# Patient Record
Sex: Female | Born: 1960 | Hispanic: No | Marital: Married | Smoking: Never smoker
Health system: Southern US, Community
[De-identification: ages and names within clinical notes are randomized; demographics above are authoritative.]

## PROBLEM LIST (undated history)

## (undated) HISTORY — PX: BREAST CYST ASPIRATION: SHX578

---

## 2006-01-08 ENCOUNTER — Other Ambulatory Visit: Admission: RE | Admit: 2006-01-08 | Discharge: 2006-01-08 | Payer: Self-pay | Admitting: Family Medicine

## 2008-12-23 HISTORY — PX: APPENDECTOMY: SHX54

## 2015-10-25 ENCOUNTER — Ambulatory Visit (INDEPENDENT_AMBULATORY_CARE_PROVIDER_SITE_OTHER): Admitting: Family Medicine

## 2015-10-25 ENCOUNTER — Encounter: Payer: Self-pay | Admitting: Family Medicine

## 2015-10-25 VITALS — BP 130/81 | HR 97 | Temp 97.3°F | Ht 62.0 in | Wt 127.8 lb

## 2015-10-25 DIAGNOSIS — R300 Dysuria: Secondary | ICD-10-CM | POA: Diagnosis not present

## 2015-10-25 DIAGNOSIS — N390 Urinary tract infection, site not specified: Secondary | ICD-10-CM | POA: Diagnosis not present

## 2015-10-25 LAB — POCT URINALYSIS DIPSTICK
BILIRUBIN UA: NEGATIVE
Glucose, UA: NEGATIVE
KETONES UA: NEGATIVE
Nitrite, UA: NEGATIVE
PROTEIN UA: NEGATIVE
Urobilinogen, UA: NEGATIVE
pH, UA: 6

## 2015-10-25 LAB — POCT UA - MICROSCOPIC ONLY
Bacteria, U Microscopic: NEGATIVE
CRYSTALS, UR, HPF, POC: NEGATIVE
Casts, Ur, LPF, POC: NEGATIVE
Mucus, UA: NEGATIVE
Yeast, UA: NEGATIVE

## 2015-10-25 MED ORDER — SULFAMETHOXAZOLE-TRIMETHOPRIM 800-160 MG PO TABS: 1.0000 | ORAL_TABLET | Freq: Two times a day (BID) | ORAL | Status: DC

## 2015-10-25 NOTE — Progress Notes (Signed)
BP 130/81 mmHg  Pulse 97  Temp(Src) 97.3 F (36.3 C) (Oral)  Ht 5\' 2"  (1.575 m)  Wt 127 lb 12.8 oz (57.97 kg)  BMI 23.37 kg/m2  LMP 05/23/2013 (Approximate)   Subjective:    Patient ID: Shelby Skinner, female    DOB: [birth year], 54 y.o.   MRN: [id number]  HPI: Shelby Skinner is a 54 y.o. female presenting on 10/25/2015 for Urinary Tract Infection   HPI Dysuria  Patient has been having odor in urine for about a week. She tried to use cranberry pills and increase fluid to hydrate out and it got slightly better but then worsened over the last 2 days. She is now having some suprapubic abdominal discomfort and burning sensation. And she also was having increased urinary frequency and burning when she urinates. She denies fevers or chills or flank pain.  Relevant past medical, surgical, family and social history reviewed and updated as indicated. Interim medical history since our last visit reviewed. Allergies and medications reviewed and updated.  Review of Systems  Constitutional: Negative for fever and chills.  HENT: Negative for congestion, ear discharge and ear pain.   Eyes: Negative for redness and visual disturbance.  Respiratory: Negative for chest tightness and shortness of breath.   Cardiovascular: Negative for chest pain and leg swelling.  Gastrointestinal: Positive for abdominal pain (suprapubic).  Genitourinary: Positive for dysuria, urgency and frequency. Negative for hematuria, flank pain, vaginal bleeding, vaginal discharge, difficulty urinating and vaginal pain.  Musculoskeletal: Negative for back pain and gait problem.  Skin: Negative for rash.  Neurological: Negative for light-headedness and headaches.  Psychiatric/Behavioral: Negative for behavioral problems and agitation.  All other systems reviewed and are negative.   Per HPI unless specifically indicated above     Medication List       This list is accurate as of: 10/25/15 11:25 AM.  Always use your  most recent med list.               Cranberry 1000 MG Caps  Take by mouth.     sulfamethoxazole-trimethoprim 800-160 MG tablet  Commonly known as:  BACTRIM DS  Take 1 tablet by mouth 2 (two) times daily.           Objective:    BP 130/81 mmHg  Pulse 97  Temp(Src) 97.3 F (36.3 C) (Oral)  Ht 5\' 2"  (1.575 m)  Wt 127 lb 12.8 oz (57.97 kg)  BMI 23.37 kg/m2  LMP 05/23/2013 (Approximate)  Wt Readings from Last 3 Encounters:  10/25/15 127 lb 12.8 oz (57.97 kg)    Physical Exam  Constitutional: She is oriented to person, place, and time. She appears well-developed and well-nourished. No distress.  Eyes: Conjunctivae and EOM are normal.  Cardiovascular: Normal rate, regular rhythm, normal heart sounds and intact distal pulses.   No murmur heard. Pulmonary/Chest: Effort normal and breath sounds normal. No respiratory distress. She has no wheezes.  Abdominal: Soft. Bowel sounds are normal. She exhibits no distension. There is tenderness (Mild suprapubic discomfort, no CVA tenderness). There is no rebound and no guarding.  Musculoskeletal: Normal range of motion. She exhibits no edema or tenderness.  Neurological: She is alert and oriented to person, place, and time. Coordination normal.  Skin: Skin is warm and dry. No rash noted. She is not diaphoretic.  Psychiatric: She has a normal mood and affect. Her behavior is normal.  Vitals reviewed.   Results for orders placed or performed in visit on 10/25/15  POCT UA -  Microscopic Only  Result Value Ref Range   WBC, Ur, HPF, POC 5-10    RBC, urine, microscopic occ    Bacteria, U Microscopic neg    Mucus, UA neg    Epithelial cells, urine per micros occ    Crystals, Ur, HPF, POC neg    Casts, Ur, LPF, POC neg    Yeast, UA neg   POCT urinalysis dipstick  Result Value Ref Range   Color, UA yellow    Clarity, UA clear    Glucose, UA neg    Bilirubin, UA neg    Ketones, UA neg    Spec Grav, UA <=1.005    Blood, UA trace     pH, UA 6.0    Protein, UA neg    Urobilinogen, UA negative    Nitrite, UA neg    Leukocytes, UA moderate (2+) (A) Negative      Assessment & Plan:   Problem List Items Addressed This Visit    None    Visit Diagnoses    Dysuria    -  Primary    Relevant Medications    sulfamethoxazole-trimethoprim (BACTRIM DS) 800-160 MG tablet    Other Relevant Orders    POCT UA - Microscopic Only (Completed)    POCT urinalysis dipstick (Completed)    UTI (lower urinary tract infection)        Relevant Medications    sulfamethoxazole-trimethoprim (BACTRIM DS) 800-160 MG tablet    Other Relevant Orders    Urine culture        Follow up plan: Return in about 2 months (around 12/25/2015), or if symptoms worsen or fail to improve, for Well woman exam/Pap/screening labs.  Arville Care, MD [facility] 10/25/2015, 11:25 AM

## 2015-10-27 LAB — URINE CULTURE

## 2016-02-07 ENCOUNTER — Ambulatory Visit (INDEPENDENT_AMBULATORY_CARE_PROVIDER_SITE_OTHER): Admitting: Family Medicine

## 2016-02-07 ENCOUNTER — Encounter: Payer: Self-pay | Admitting: Family Medicine

## 2016-02-07 VITALS — BP 138/86 | HR 71 | Temp 97.7°F | Ht 62.0 in | Wt 128.2 lb

## 2016-02-07 DIAGNOSIS — Z114 Encounter for screening for human immunodeficiency virus [HIV]: Secondary | ICD-10-CM

## 2016-02-07 DIAGNOSIS — R5382 Chronic fatigue, unspecified: Secondary | ICD-10-CM

## 2016-02-07 DIAGNOSIS — Z01419 Encounter for gynecological examination (general) (routine) without abnormal findings: Secondary | ICD-10-CM | POA: Diagnosis not present

## 2016-02-07 DIAGNOSIS — Z1159 Encounter for screening for other viral diseases: Secondary | ICD-10-CM

## 2016-02-07 DIAGNOSIS — Z13220 Encounter for screening for lipoid disorders: Secondary | ICD-10-CM

## 2016-02-07 DIAGNOSIS — Z1211 Encounter for screening for malignant neoplasm of colon: Secondary | ICD-10-CM

## 2016-02-07 DIAGNOSIS — Z0000 Encounter for general adult medical examination without abnormal findings: Secondary | ICD-10-CM

## 2016-02-07 DIAGNOSIS — Z131 Encounter for screening for diabetes mellitus: Secondary | ICD-10-CM

## 2016-02-07 DIAGNOSIS — E049 Nontoxic goiter, unspecified: Secondary | ICD-10-CM

## 2016-02-07 NOTE — Progress Notes (Signed)
BP 138/86 mmHg  Pulse 71  Temp(Src) 97.7 F (36.5 C) (Oral)  Ht 5' 2" (1.575 m)  Wt 128 lb 3.2 oz (58.151 kg)  BMI 23.44 kg/m2  LMP 05/23/2013 (Approximate)   Subjective:    Patient ID: Shelby Skinner, female    DOB: [birth year], 55 y.o.   MRN: [id number]  HPI: Shelby Skinner is a 55 y.o. female presenting on 02/07/2016 for Annual Exam and Gynecologic Exam   HPI Well woman exam Patient is coming in today for well woman exam. It is been quite a few years since she's had a Pap and will exam. She did have a mammogram 3 years ago which was normal. She has not had a menstrual period in over a year and a half so she is all into menopause at this point. She denies any chest pain, shortness of breath, headaches or vision issues, abdominal complaints, diarrhea, nausea, vomiting, or joint issues. She does complain of some fatigue and has a known thyroid goiter this been worked up in the past. She also complains of memory issues and wants to have her memory tested and wants a test B-12 and vitamin D because of it. She is currently taking replacement performed wants to see where they're at.  Relevant past medical, surgical, family and social history reviewed and updated as indicated. Interim medical history since our last visit reviewed. Allergies and medications reviewed and updated.  Review of Systems  Constitutional: Positive for fatigue. Negative for fever and chills.  HENT: Negative for congestion, ear discharge and ear pain.   Eyes: Negative for redness and visual disturbance.  Respiratory: Negative for chest tightness and shortness of breath.   Cardiovascular: Negative for chest pain and leg swelling.  Genitourinary: Negative for dysuria and difficulty urinating.  Musculoskeletal: Negative for back pain and gait problem.  Skin: Negative for rash.  Neurological: Negative for light-headedness and headaches.  Psychiatric/Behavioral: Negative for suicidal ideas, behavioral problems,  sleep disturbance, self-injury, dysphoric mood and agitation. The patient is not nervous/anxious.   All other systems reviewed and are negative.   Per HPI unless specifically indicated above     Medication List       This list is accurate as of: 02/07/16  9:48 AM.  Always use your most recent med list.               cholecalciferol 1000 units tablet  Commonly known as:  VITAMIN D  Take 1,000 Units by mouth daily.     Cranberry 1000 MG Caps  Take by mouth. As needed     vitamin B-12 100 MCG tablet  Commonly known as:  CYANOCOBALAMIN  Take 100 mcg by mouth daily.           Objective:    BP 138/86 mmHg  Pulse 71  Temp(Src) 97.7 F (36.5 C) (Oral)  Ht 5' 2" (1.575 m)  Wt 128 lb 3.2 oz (58.151 kg)  BMI 23.44 kg/m2  LMP 05/23/2013 (Approximate)  Wt Readings from Last 3 Encounters:  02/07/16 128 lb 3.2 oz (58.151 kg)  10/25/15 127 lb 12.8 oz (57.97 kg)    Physical Exam  Constitutional: She is oriented to person, place, and time. She appears well-developed and well-nourished. No distress.  Eyes: Conjunctivae and EOM are normal. Pupils are equal, round, and reactive to light.  Neck: Neck supple. No tracheal deviation present. Thyromegaly (smooth and slightly enlarged thyroid) present.  Cardiovascular: Normal rate, regular rhythm, normal heart sounds and intact distal pulses.  No murmur heard. Pulmonary/Chest: Effort normal and breath sounds normal. No respiratory distress. She has no wheezes. She has no rales. Right breast exhibits no inverted nipple, no mass, no nipple discharge, no skin change and no tenderness. Left breast exhibits no inverted nipple, no mass, no nipple discharge, no skin change and no tenderness. Breasts are symmetrical.  Patient has bilateral fibrocystic/dense breasts but no nodules palpable  Abdominal: Soft. Bowel sounds are normal. She exhibits no distension. There is no tenderness. There is no rebound and no guarding.  Genitourinary: Vagina  normal and uterus normal. No breast swelling, tenderness, discharge or bleeding. Pelvic exam was performed with patient supine. There is no rash or lesion on the right labia. There is no rash or lesion on the left labia. Uterus is not deviated, not enlarged, not fixed and not tender. Cervix exhibits no motion tenderness, no discharge and no friability. Right adnexum displays no mass and no tenderness. Left adnexum displays no mass and no tenderness.  Musculoskeletal: Normal range of motion. She exhibits no edema or tenderness.  Lymphadenopathy:    She has no cervical adenopathy.    She has no axillary adenopathy.  Neurological: She is alert and oriented to person, place, and time. Coordination normal.  Skin: Skin is warm and dry. No rash noted. She is not diaphoretic.  Psychiatric: She has a normal mood and affect. Her behavior is normal.  Nursing note and vitals reviewed.   Results for orders placed or performed in visit on 10/25/15  Urine culture  Result Value Ref Range   Urine Culture, Routine Final report (A)    Urine Culture result 1 Escherichia coli (A)    ANTIMICROBIAL SUSCEPTIBILITY Comment   POCT UA - Microscopic Only  Result Value Ref Range   WBC, Ur, HPF, POC 5-10    RBC, urine, microscopic occ    Bacteria, U Microscopic neg    Mucus, UA neg    Epithelial cells, urine per micros occ    Crystals, Ur, HPF, POC neg    Casts, Ur, LPF, POC neg    Yeast, UA neg   POCT urinalysis dipstick  Result Value Ref Range   Color, UA yellow    Clarity, UA clear    Glucose, UA neg    Bilirubin, UA neg    Ketones, UA neg    Spec Grav, UA <=1.005    Blood, UA trace    pH, UA 6.0    Protein, UA neg    Urobilinogen, UA negative    Nitrite, UA neg    Leukocytes, UA moderate (2+) (A) Negative   MMSE of 30 out of 30    Assessment & Plan:   Problem List Items Addressed This Visit    None    Visit Diagnoses    Well woman exam with routine gynecological exam    -  Primary     Relevant Orders    Pap IG, rfx HPV all pth    Special screening for malignant neoplasms, colon        Relevant Orders    Ambulatory referral to Gastroenterology    Screening for diabetes mellitus        Relevant Orders    CMP14+EGFR    Screening for lipid disorders        Relevant Orders    Lipid panel    Thyroid goiter        Relevant Orders    TSH    Chronic fatigue  Relevant Orders    VITAMIN D 25 Hydroxy (Vit-D Deficiency, Fractures)    Vitamin B12    TSH    CBC with Differential/Platelet    Need for hepatitis C screening test        Relevant Orders    Hepatitis C antibody    Encounter for screening for HIV        Relevant Orders    HIV antibody        Follow up plan: Return in about 1 year (around 02/06/2017), or if symptoms worsen or fail to improve.  Counseling provided for all of the vaccine components Orders Placed This Encounter  Procedures  . CMP14+EGFR  . Lipid panel  . VITAMIN D 25 Hydroxy (Vit-D Deficiency, Fractures)  . Vitamin B12  . TSH  . CBC with Differential/Platelet  . HIV antibody  . Hepatitis C antibody  . Ambulatory referral to Gastroenterology    Caryl Pina, MD [facility] 02/07/2016, 9:48 AM

## 2016-02-08 LAB — HIV ANTIBODY (ROUTINE TESTING W REFLEX): HIV Screen 4th Generation wRfx: NONREACTIVE

## 2016-02-08 LAB — CMP14+EGFR
A/G RATIO: 2.1 (ref 1.1–2.5)
ALBUMIN: 4.7 g/dL (ref 3.5–5.5)
ALK PHOS: 63 IU/L (ref 39–117)
ALT: 13 IU/L (ref 0–32)
AST: 23 IU/L (ref 0–40)
BILIRUBIN TOTAL: 0.5 mg/dL (ref 0.0–1.2)
BUN / CREAT RATIO: 16 (ref 9–23)
BUN: 14 mg/dL (ref 6–24)
CHLORIDE: 102 mmol/L (ref 96–106)
CO2: 24 mmol/L (ref 18–29)
Calcium: 10 mg/dL (ref 8.7–10.2)
Creatinine, Ser: 0.9 mg/dL (ref 0.57–1.00)
GFR calc Af Amer: 84 mL/min/{1.73_m2} (ref >59)
GFR calc non Af Amer: 73 mL/min/{1.73_m2} (ref >59)
GLOBULIN, TOTAL: 2.2 g/dL (ref 1.5–4.5)
Glucose: 91 mg/dL (ref 65–99)
POTASSIUM: 4.6 mmol/L (ref 3.5–5.2)
SODIUM: 142 mmol/L (ref 134–144)
Total Protein: 6.9 g/dL (ref 6.0–8.5)

## 2016-02-08 LAB — CBC WITH DIFFERENTIAL/PLATELET
BASOS: 1 %
Basophils Absolute: 0 10*3/uL (ref 0.0–0.2)
EOS (ABSOLUTE): 0.1 10*3/uL (ref 0.0–0.4)
Eos: 2 %
HEMOGLOBIN: 13.1 g/dL (ref 11.1–15.9)
Hematocrit: 39.1 % (ref 34.0–46.6)
Immature Grans (Abs): 0 10*3/uL (ref 0.0–0.1)
Immature Granulocytes: 0 %
LYMPHS ABS: 1.2 10*3/uL (ref 0.7–3.1)
Lymphs: 27 %
MCH: 28.6 pg (ref 26.6–33.0)
MCHC: 33.5 g/dL (ref 31.5–35.7)
MCV: 85 fL (ref 79–97)
MONOCYTES: 6 %
MONOS ABS: 0.3 10*3/uL (ref 0.1–0.9)
Neutrophils Absolute: 3 10*3/uL (ref 1.4–7.0)
Neutrophils: 64 %
Platelets: 227 10*3/uL (ref 150–379)
RBC: 4.58 x10E6/uL (ref 3.77–5.28)
RDW: 13.5 % (ref 12.3–15.4)
WBC: 4.7 10*3/uL (ref 3.4–10.8)

## 2016-02-08 LAB — LIPID PANEL
CHOLESTEROL TOTAL: 209 mg/dL — AB (ref 100–199)
Chol/HDL Ratio: 4.1 ratio units (ref 0.0–4.4)
HDL: 51 mg/dL (ref >39)
LDL CALC: 131 mg/dL — AB (ref 0–99)
Triglycerides: 135 mg/dL (ref 0–149)
VLDL Cholesterol Cal: 27 mg/dL (ref 5–40)

## 2016-02-08 LAB — HEPATITIS C ANTIBODY

## 2016-02-08 LAB — VITAMIN B12: Vitamin B-12: 501 pg/mL (ref 211–946)

## 2016-02-08 LAB — VITAMIN D 25 HYDROXY (VIT D DEFICIENCY, FRACTURES): VIT D 25 HYDROXY: 37.8 ng/mL (ref 30.0–100.0)

## 2016-02-08 LAB — TSH: TSH: 1.33 u[IU]/mL (ref 0.450–4.500)

## 2016-02-10 LAB — PAP IG, RFX HPV ALL PTH: PAP SMEAR COMMENT: 0

## 2016-03-12 DIAGNOSIS — Z1211 Encounter for screening for malignant neoplasm of colon: Secondary | ICD-10-CM | POA: Insufficient documentation

## 2016-03-12 LAB — HM COLONOSCOPY

## 2016-05-21 ENCOUNTER — Encounter: Admitting: *Deleted

## 2017-02-13 ENCOUNTER — Encounter: Payer: Self-pay | Admitting: Family

## 2017-02-13 ENCOUNTER — Ambulatory Visit (INDEPENDENT_AMBULATORY_CARE_PROVIDER_SITE_OTHER): Admitting: Family

## 2017-02-13 VITALS — BP 102/64 | HR 59 | Temp 98.6°F | Ht 62.0 in | Wt 127.4 lb

## 2017-02-13 DIAGNOSIS — R05 Cough: Secondary | ICD-10-CM | POA: Diagnosis not present

## 2017-02-13 DIAGNOSIS — B349 Viral infection, unspecified: Secondary | ICD-10-CM

## 2017-02-13 DIAGNOSIS — R059 Cough, unspecified: Secondary | ICD-10-CM

## 2017-02-13 MED ORDER — PREDNISONE 10 MG (21) PO TBPK: ORAL_TABLET | ORAL | 0 refills | Status: DC

## 2017-02-13 NOTE — Progress Notes (Signed)
   Subjective:    Patient ID: Shelby Skinner, female    DOB: Jul 15, 1961, 56 y.o.   MRN: [id number]  Cough  This is a new problem. The current episode started 1 to 4 weeks ago. The problem has been waxing and waning. The problem occurs every few minutes. The cough is productive of sputum. Associated symptoms include chills, ear pain, nasal congestion, postnasal drip and a sore throat. Pertinent negatives include no ear congestion, fever, headaches, myalgias, rhinorrhea, shortness of breath or wheezing. The symptoms are aggravated by lying down. She has tried rest and OTC cough suppressant for the symptoms. The treatment provided mild relief. There is no history of asthma or COPD.      Review of Systems  Constitutional: Positive for chills. Negative for fever.  HENT: Positive for ear pain, postnasal drip and sore throat. Negative for rhinorrhea.   Respiratory: Positive for cough. Negative for shortness of breath and wheezing.   Musculoskeletal: Negative for myalgias.  Neurological: Negative for headaches.  All other systems reviewed and are negative.      Objective:   Physical Exam  Constitutional: She is oriented to person, place, and time. She appears well-developed and well-nourished. No distress.  HENT:  Head: Normocephalic and atraumatic.  Right Ear: External ear normal.  Left Ear: External ear normal.  Nose: Mucosal edema and rhinorrhea present.  Mouth/Throat: Posterior oropharyngeal erythema present.  Eyes: Pupils are equal, round, and reactive to light.  Neck: Normal range of motion. Neck supple. No thyromegaly present.  Cardiovascular: Normal rate, regular rhythm, normal heart sounds and intact distal pulses.   No murmur heard. Pulmonary/Chest: Effort normal and breath sounds normal. No respiratory distress. She has no wheezes.  Abdominal: Soft. Bowel sounds are normal. She exhibits no distension. There is no tenderness.  Musculoskeletal: Normal range of motion. She  exhibits no edema or tenderness.  Neurological: She is alert and oriented to person, place, and time. She has normal reflexes. No cranial nerve deficit.  Skin: Skin is warm and dry.  Psychiatric: She has a normal mood and affect. Her behavior is normal. Judgment and thought content normal.  Vitals reviewed.     BP 102/64   Pulse (!) 59   Temp 98.6 F (37 C) (Oral)   Ht 5\' 2"  (1.575 m)   Wt 127 lb 6.4 oz (57.8 kg)   LMP 05/23/2013 (Approximate)   BMI 23.30 kg/m      Assessment & Plan:  1. Viral illness - predniSONE (STERAPRED UNI-PAK 21 TAB) 10 MG (21) TBPK tablet; Use as directed  Dispense: 21 tablet; Refill: 0  2. Cough  Will send in prednisone dose pack for patient, pt states she will wait a 1-2 days to see if symptoms will improve before starting Force fluids Rest Good hand hygiene discussed RTO prn   Jannifer Rodneyhristy Gaetan Spieker, FNP

## 2017-02-13 NOTE — Patient Instructions (Signed)

## 2017-10-24 ENCOUNTER — Ambulatory Visit (INDEPENDENT_AMBULATORY_CARE_PROVIDER_SITE_OTHER): Admitting: Family

## 2017-10-24 ENCOUNTER — Encounter: Payer: Self-pay | Admitting: Family

## 2017-10-24 VITALS — BP 129/81 | HR 83 | Temp 98.3°F | Ht 62.0 in | Wt 128.0 lb

## 2017-10-24 DIAGNOSIS — J301 Allergic rhinitis due to pollen: Secondary | ICD-10-CM

## 2017-10-24 DIAGNOSIS — J069 Acute upper respiratory infection, unspecified: Secondary | ICD-10-CM

## 2017-10-24 MED ORDER — FLUTICASONE PROPIONATE 50 MCG/ACT NA SUSP: 2.0000 | Freq: Every day | NASAL | 6 refills | Status: DC

## 2017-10-24 NOTE — Patient Instructions (Signed)
Upper Respiratory Infection, Adult Most upper respiratory infections (URIs) are a viral infection of the air passages leading to the lungs. A URI affects the nose, throat, and upper air passages. The most common type of URI is nasopharyngitis and is typically referred to as "the common cold." URIs run their course and usually go away on their own. Most of the time, a URI does not require medical attention, but sometimes a bacterial infection in the upper airways can follow a viral infection. This is called a secondary infection. Sinus and middle ear infections are common types of secondary upper respiratory infections. Bacterial pneumonia can also complicate a URI. A URI can worsen asthma and chronic obstructive pulmonary disease (COPD). Sometimes, these complications can require emergency medical care and may be life threatening. What are the causes? Almost all URIs are caused by viruses. A virus is a type of germ and can spread from one person to another. What increases the risk? You may be at risk for a URI if:  You smoke.  You have chronic heart or lung disease.  You have a weakened defense (immune) system.  You are very young or very old.  You have nasal allergies or asthma.  You work in crowded or poorly ventilated areas.  You work in health care facilities or schools.  What are the signs or symptoms? Symptoms typically develop 2-3 days after you come in contact with a cold virus. Most viral URIs last 7-10 days. However, viral URIs from the influenza virus (flu virus) can last 14-18 days and are typically more severe. Symptoms may include:  Runny or stuffy (congested) nose.  Sneezing.  Cough.  Sore throat.  Headache.  Fatigue.  Fever.  Loss of appetite.  Pain in your forehead, behind your eyes, and over your cheekbones (sinus pain).  Muscle aches.  How is this diagnosed? Your health care provider may diagnose a URI by:  Physical exam.  Tests to check that your  symptoms are not due to another condition such as: ? Strep throat. ? Sinusitis. ? Pneumonia. ? Asthma.  How is this treated? A URI goes away on its own with time. It cannot be cured with medicines, but medicines may be prescribed or recommended to relieve symptoms. Medicines may help:  Reduce your fever.  Reduce your cough.  Relieve nasal congestion.  Follow these instructions at home:  Take medicines only as directed by your health care provider.  Gargle warm saltwater or take cough drops to comfort your throat as directed by your health care provider.  Use a warm mist humidifier or inhale steam from a shower to increase air moisture. This may make it easier to breathe.  Drink enough fluid to keep your urine clear or pale yellow.  Eat soups and other clear broths and maintain good nutrition.  Rest as needed.  Return to work when your temperature has returned to normal or as your health care provider advises. You may need to stay home longer to avoid infecting others. You can also use a face mask and careful hand washing to prevent spread of the virus.  Increase the usage of your inhaler if you have asthma.  Do not use any tobacco products, including cigarettes, chewing tobacco, or electronic cigarettes. If you need help quitting, ask your health care provider. How is this prevented? The best way to protect yourself from getting a cold is to practice good hygiene.  Avoid oral or hand contact with people with cold symptoms.  Wash your   hands often if contact occurs.  There is no clear evidence that vitamin C, vitamin E, echinacea, or exercise reduces the chance of developing a cold. However, it is always recommended to get plenty of rest, exercise, and practice good nutrition. Contact a health care provider if:  You are getting worse rather than better.  Your symptoms are not controlled by medicine.  You have chills.  You have worsening shortness of breath.  You have  brown or red mucus.  You have yellow or brown nasal discharge.  You have pain in your face, especially when you bend forward.  You have a fever.  You have swollen neck glands.  You have pain while swallowing.  You have white areas in the back of your throat. Get help right away if:  You have severe or persistent: ? Headache. ? Ear pain. ? Sinus pain. ? Chest pain.  You have chronic lung disease and any of the following: ? Wheezing. ? Prolonged cough. ? Coughing up blood. ? A change in your usual mucus.  You have a stiff neck.  You have changes in your: ? Vision. ? Hearing. ? Thinking. ? Mood. This information is not intended to replace advice given to you by your health care provider. Make sure you discuss any questions you have with your health care provider. Document Released: 06/04/2001 Document Revised: 08/11/2016 Document Reviewed: 03/16/2014 Elsevier Interactive Patient Education  2017 [employer]  

## 2017-10-24 NOTE — Progress Notes (Signed)
   Subjective:    Patient ID: Shelby HuffCharlene Nightengale, female    DOB: April 16, 1961, 56 y.o.   MRN: [id number]  Sinus Problem  This is a new problem. The current episode started in the past 7 days. The problem is unchanged. There has been no fever. She is experiencing no pain. Associated symptoms include congestion, a hoarse voice, sinus pressure, sneezing and a sore throat. Pertinent negatives include no ear pain or headaches. Treatments tried: claritin  The treatment provided mild relief.      Review of Systems  HENT: Positive for congestion, hoarse voice, sinus pressure, sneezing and sore throat. Negative for ear pain.   Neurological: Negative for headaches.  All other systems reviewed and are negative.      Objective:   Physical Exam  Constitutional: She is oriented to person, place, and time. She appears well-developed and well-nourished. No distress.  HENT:  Head: Normocephalic and atraumatic.  Right Ear: External ear normal.  Left Ear: External ear normal.  Nose: Mucosal edema and rhinorrhea present. Right sinus exhibits no maxillary sinus tenderness and no frontal sinus tenderness. Left sinus exhibits no maxillary sinus tenderness and no frontal sinus tenderness.  Mouth/Throat: Posterior oropharyngeal erythema present.  Eyes: Pupils are equal, round, and reactive to light.  Neck: Normal range of motion. Neck supple. No thyromegaly present.  Cardiovascular: Normal rate, regular rhythm, normal heart sounds and intact distal pulses.   No murmur heard. Pulmonary/Chest: Effort normal and breath sounds normal. No respiratory distress. She has no wheezes.  Abdominal: Soft. Bowel sounds are normal. She exhibits no distension. There is no tenderness.  Musculoskeletal: Normal range of motion. She exhibits no edema or tenderness.  Neurological: She is alert and oriented to person, place, and time. She has normal reflexes. No cranial nerve deficit.  Skin: Skin is warm and dry.  Psychiatric: She  has a normal mood and affect. Her behavior is normal. Judgment and thought content normal.  Vitals reviewed.     Temp 98.3 F (36.8 C) (Oral)   Ht 5\' 2"  (1.575 m)   Wt 128 lb (58.1 kg)   LMP 05/23/2013 (Approximate)   BMI 23.41 kg/m      Assessment & Plan:  1. Viral upper respiratory tract infection - Take meds as prescribed - Use a cool mist humidifier  -Use saline nose sprays frequently -Saline irrigations of the nose can be very helpful if done frequently. -Force fluids -For any cough or congestion  Use plain Mucinex- regular strength or max strength is fine -For fever or aces or pains- take tylenol or ibuprofen appropriate for age and weight -Throat lozenges if help - fluticasone (FLONASE) 50 MCG/ACT nasal spray; Place 2 sprays into both nostrils daily.  Dispense: 16 g; Refill: 6  2. Allergic rhinitis due to pollen, unspecified seasonality - fluticasone (FLONASE) 50 MCG/ACT nasal spray; Place 2 sprays into both nostrils daily.  Dispense: 16 g; Refill: 6   Jannifer Rodneyhristy Afshin Chrystal, FNP

## 2017-10-27 ENCOUNTER — Telehealth: Payer: Self-pay | Admitting: Family

## 2017-10-27 NOTE — Telephone Encounter (Signed)
Pt is having continued drainage (green) and hoarseness Pt requesting antibiotic Please review and advise

## 2017-10-28 MED ORDER — AZITHROMYCIN 250 MG PO TABS: ORAL_TABLET | ORAL | 0 refills | Status: DC

## 2017-10-28 NOTE — Telephone Encounter (Signed)
Zpak Prescription sent to pharmacy   

## 2017-10-28 NOTE — Telephone Encounter (Signed)
Patient aware zpack has been sent to pharmacy

## 2018-10-16 ENCOUNTER — Telehealth: Payer: Self-pay | Admitting: Family Medicine

## 2018-10-26 ENCOUNTER — Encounter: Payer: Self-pay | Admitting: Family

## 2018-10-26 ENCOUNTER — Ambulatory Visit (INDEPENDENT_AMBULATORY_CARE_PROVIDER_SITE_OTHER): Admitting: Family

## 2018-10-26 VITALS — BP 148/91 | HR 84 | Temp 98.2°F | Ht 62.0 in | Wt 127.6 lb

## 2018-10-26 DIAGNOSIS — R002 Palpitations: Secondary | ICD-10-CM | POA: Diagnosis not present

## 2018-10-26 DIAGNOSIS — F419 Anxiety disorder, unspecified: Secondary | ICD-10-CM | POA: Diagnosis not present

## 2018-10-26 DIAGNOSIS — Z09 Encounter for follow-up examination after completed treatment for conditions other than malignant neoplasm: Secondary | ICD-10-CM

## 2018-10-26 DIAGNOSIS — E876 Hypokalemia: Secondary | ICD-10-CM | POA: Diagnosis not present

## 2018-10-26 DIAGNOSIS — F439 Reaction to severe stress, unspecified: Secondary | ICD-10-CM

## 2018-10-26 NOTE — Patient Instructions (Signed)
Stress and Stress Management Stress is a normal reaction to life events. It is what you feel when life demands more than you are used to or more than you can handle. Some stress can be useful. For example, the stress reaction can help you catch the last bus of the day, study for a test, or meet a deadline at work. But stress that occurs too often or for too long can cause problems. It can affect your emotional health and interfere with relationships and normal daily activities. Too much stress can weaken your immune system and increase your risk for physical illness. If you already have a medical problem, stress can make it worse. What are the causes? All sorts of life events may cause stress. An event that causes stress for one person may not be stressful for another person. Major life events commonly cause stress. These may be positive or negative. Examples include losing your job, moving into a new home, getting married, having a baby, or losing a loved one. Less obvious life events may also cause stress, especially if they occur day after day or in combination. Examples include working long hours, driving in traffic, caring for children, being in debt, or being in a difficult relationship. What are the signs or symptoms? Stress may cause emotional symptoms including, the following:  Anxiety. This is feeling worried, afraid, on edge, overwhelmed, or out of control.  Anger. This is feeling irritated or impatient.  Depression. This is feeling sad, down, helpless, or guilty.  Difficulty focusing, remembering, or making decisions.  Stress may cause physical symptoms, including the following:  Aches and pains. These may affect your head, neck, back, stomach, or other areas of your body.  Tight muscles or clenched jaw.  Low energy or trouble sleeping.  Stress may cause unhealthy behaviors, including the following:  Eating to feel better (overeating) or skipping meals.  Sleeping too little,  too much, or both.  Working too much or putting off tasks (procrastination).  Smoking, drinking alcohol, or using drugs to feel better.  How is this diagnosed? Stress is diagnosed through an assessment by your health care provider. Your health care provider will ask questions about your symptoms and any stressful life events.Your health care provider will also ask about your medical history and may order blood tests or other tests. Certain medical conditions and medicine can cause physical symptoms similar to stress. Mental illness can cause emotional symptoms and unhealthy behaviors similar to stress. Your health care provider may refer you to a mental health professional for further evaluation. How is this treated? Stress management is the recommended treatment for stress.The goals of stress management are reducing stressful life events and coping with stress in healthy ways. Techniques for reducing stressful life events include the following:  Stress identification. Self-monitor for stress and identify what causes stress for you. These skills may help you to avoid some stressful events.  Time management. Set your priorities, keep a calendar of events, and learn to say "no." These tools can help you avoid making too many commitments.  Techniques for coping with stress include the following:  Rethinking the problem. Try to think realistically about stressful events rather than ignoring them or overreacting. Try to find the positives in a stressful situation rather than focusing on the negatives.  Exercise. Physical exercise can release both physical and emotional tension. The key is to find a form of exercise you enjoy and do it regularly.  Relaxation techniques. These relax the body and  mind. Examples include yoga, meditation, tai chi, biofeedback, deep breathing, progressive muscle relaxation, listening to music, being out in nature, journaling, and other hobbies. Again, the key is to find  one or more that you enjoy and can do regularly.  Healthy lifestyle. Eat a balanced diet, get plenty of sleep, and do not smoke. Avoid using alcohol or drugs to relax.  Strong support network. Spend time with family, friends, or other people you enjoy being around.Express your feelings and talk things over with someone you trust.  Counseling or talktherapy with a mental health professional may be helpful if you are having difficulty managing stress on your own. Medicine is typically not recommended for the treatment of stress.Talk to your health care provider if you think you need medicine for symptoms of stress. Follow these instructions at home:  Keep all follow-up visits as directed by your health care provider.  Take all medicines as directed by your health care provider. Contact a health care provider if:  Your symptoms get worse or you start having new symptoms.  You feel overwhelmed by your problems and can no longer manage them on your own. Get help right away if:  You feel like hurting yourself or someone else. This information is not intended to replace advice given to you by your health care provider. Make sure you discuss any questions you have with your health care provider. Document Released: 06/04/2001 Document Revised: 05/16/2016 Document Reviewed: 08/03/2013 Elsevier Interactive Patient Education  2017 [employer]  

## 2018-10-26 NOTE — Progress Notes (Signed)
   Subjective:    Patient ID: Shelby Skinner, female    DOB: Dec 01, 1961, 57 y.o.   MRN: [id number]  Chief Complaint  Patient presents with  . Follow-up    emergency department at [facility] presents to the office today for ED follow up. She went to the ED on 10/16/18 with palpitations. She states her father had three strokes  and was actually staying the night with her dad. She was sent to the ED and found to have a potassium of 3.2 and was given a bolus and discharged on potassium chloride 10 meq BID for 7 days. She has completed this and states she is doing well now. She does admit her stress continues to be hight with caring for her father and planing a missions trip in two weeks, but denies any other palpitations.   She had a negative D-Dimer, troponin, urine, and chest x-ray. EKG only showed sinus tachycardia.   [facility] notes reviewed.   Review of Systems  Constitutional: Negative.   HENT: Negative.   Eyes: Negative.   Respiratory: Negative.  Negative for shortness of breath.   Cardiovascular: Negative.  Negative for palpitations.  Gastrointestinal: Negative.   Endocrine: Negative.   Genitourinary: Negative.   Musculoskeletal: Negative.   Neurological: Negative.  Negative for headaches.  Hematological: Negative.   Psychiatric/Behavioral: Negative.   All other systems reviewed and are negative.      Objective:   Physical Exam  Constitutional: She is oriented to person, place, and time. She appears well-developed and well-nourished. No distress.  HENT:  Head: Normocephalic and atraumatic.  Right Ear: External ear normal.  Left Ear: External ear normal.  Mouth/Throat: Oropharynx is clear and moist.  Eyes: Pupils are equal, round, and reactive to light.  Neck: Normal range of motion. Neck supple. No thyromegaly present.  Cardiovascular: Normal rate, regular rhythm, normal heart sounds and intact distal pulses.  No murmur heard. Pulmonary/Chest: Effort normal  and breath sounds normal. No respiratory distress. She has no wheezes.  Abdominal: Soft. Bowel sounds are normal. She exhibits no distension. There is no tenderness.  Musculoskeletal: Normal range of motion. She exhibits no edema or tenderness.  Neurological: She is alert and oriented to person, place, and time. She has normal reflexes. No cranial nerve deficit.  Skin: Skin is warm and dry.  Psychiatric: She has a normal mood and affect. Her behavior is normal. Judgment and thought content normal.  Vitals reviewed.     BP (!) 148/91   Pulse 84   Temp 98.2 F (36.8 C) (Oral)   Ht '5\' 2"'$  (1.575 m)   Wt 127 lb 9.6 oz (57.9 kg)   LMP 05/23/2013 (Approximate)   BMI 23.34 kg/m      Assessment & Plan:  Christne Platts comes in today with chief complaint of Follow-up (emergency department at [facility])   Diagnosis and orders addressed:  1. Hypokalemia Labs pending Potassium rich foods - BMP8+EGFR  2. Anxiety Stress management Pt does not wish to start any medication today - BMP8+EGFR  3. Stress - BMP8+EGFR  4. Palpitations Resolved - BMP8+EGFR  5. Hospital discharge follow-up - BMP8+EGFR   Labs pending Health Maintenance reviewed Diet and exercise encouraged  Follow up plan: As needed and keep follow up with PCP   Evelina Dun, FNP

## 2018-10-27 LAB — BMP8+EGFR
BUN/Creatinine Ratio: 15 (ref 9–23)
BUN: 13 mg/dL (ref 6–24)
CALCIUM: 10 mg/dL (ref 8.7–10.2)
CHLORIDE: 99 mmol/L (ref 96–106)
CO2: 24 mmol/L (ref 20–29)
Creatinine, Ser: 0.84 mg/dL (ref 0.57–1.00)
GFR calc non Af Amer: 78 mL/min/{1.73_m2} (ref >59)
GFR, EST AFRICAN AMERICAN: 90 mL/min/{1.73_m2} (ref >59)
Glucose: 92 mg/dL (ref 65–99)
Potassium: 4.2 mmol/L (ref 3.5–5.2)
Sodium: 139 mmol/L (ref 134–144)

## 2018-11-27 ENCOUNTER — Encounter: Payer: Self-pay | Admitting: Family Medicine

## 2018-11-27 ENCOUNTER — Ambulatory Visit (INDEPENDENT_AMBULATORY_CARE_PROVIDER_SITE_OTHER): Admitting: Family Medicine

## 2018-11-27 VITALS — BP 139/84 | HR 92 | Temp 97.2°F | Ht 62.0 in | Wt 128.0 lb

## 2018-11-27 DIAGNOSIS — N3091 Cystitis, unspecified with hematuria: Secondary | ICD-10-CM

## 2018-11-27 DIAGNOSIS — R300 Dysuria: Secondary | ICD-10-CM

## 2018-11-27 LAB — MICROSCOPIC EXAMINATION

## 2018-11-27 LAB — URINALYSIS, COMPLETE
Bilirubin, UA: NEGATIVE
Glucose, UA: NEGATIVE
Ketones, UA: NEGATIVE
NITRITE UA: NEGATIVE
PH UA: 6 (ref 5.0–7.5)
Protein, UA: NEGATIVE
Specific Gravity, UA: <1.005 — ABNORMAL LOW (ref 1.005–1.030)
UUROB: 0.2 mg/dL (ref 0.2–1.0)

## 2018-11-27 MED ORDER — NITROFURANTOIN MONOHYD MACRO 100 MG PO CAPS: 100.0000 mg | ORAL_CAPSULE | Freq: Two times a day (BID) | ORAL | 0 refills | Status: AC

## 2018-11-27 NOTE — Patient Instructions (Signed)

## 2018-11-27 NOTE — Progress Notes (Signed)
   Subjective:    Patient ID: Shelby Skinner, female    DOB: 11/21/1961, 57 y.o.   MRN: [id number]  Chief Complaint:  Urinary Urgency and Dysuria   HPI: Shelby Skinner is a 57 y.o. female presenting on 11/27/2018 for Urinary Urgency and Dysuria  Pt presents today with complaints of dysuria, frequency, and urgency. This is a new problem. The current episode started today. The problem occurs with every void. The problem has been gradually worsening. The quality of the pain is described as burning. The pain is at a severity of 5/10. Associated symptoms include lower abdominal pressure. Pt denies fever, chills, confusion, vaginal discharge, or vaginal bleeding. Pt has tried cranberry tables for the symptoms. The treatment provided some relief.    Relevant past medical, surgical, family, and social history reviewed and updated as indicated.  Allergies and medications reviewed and updated.   History reviewed. No pertinent past medical history.  Past Surgical History:  Procedure Laterality Date  . APPENDECTOMY  2010    Social History   Socioeconomic History  . Marital status: Married    Spouse name: Not on file  . Number of children: Not on file  . Years of education: Not on file  . Highest education level: Not on file  Occupational History  . Not on file  Social Needs  . Financial resource strain: Not on file  . Food insecurity:    Worry: Not on file    Inability: Not on file  . Transportation needs:    Medical: Not on file    Non-medical: Not on file  Tobacco Use  . Smoking status: Never Smoker  . Smokeless tobacco: Never Used  Substance and Sexual Activity  . Alcohol use: Yes    Alcohol/week: 1.0 standard drinks    Types: 1 Glasses of wine per week  . Drug use: Not on file  . Sexual activity: Not on file  Lifestyle  . Physical activity:    Days per week: Not on file    Minutes per session: Not on file  . Stress: Not on file  Relationships  . Social  connections:    Talks on phone: Not on file    Gets together: Not on file    Attends religious service: Not on file    Active member of club or organization: Not on file    Attends meetings of clubs or organizations: Not on file    Relationship status: Not on file  . Intimate partner violence:    Fear of current or ex partner: Not on file    Emotionally abused: Not on file    Physically abused: Not on file    Forced sexual activity: Not on file  Other Topics Concern  . Not on file  Social History Narrative  . Not on file    Outpatient Encounter Medications as of 11/27/2018  Medication Sig  . cholecalciferol (VITAMIN D) 1000 units tablet Take 1,000 Units by mouth daily.  . cyanocobalamin 100 MCG tablet Take 100 mcg by mouth daily.  . nitrofurantoin, macrocrystal-monohydrate, (MACROBID) 100 MG capsule Take 1 capsule (100 mg total) by mouth 2 (two) times daily for 7 days.   No facility-administered encounter medications on file as of 11/27/2018.     Allergies  Allergen Reactions  . Codeine Nausea Only    Review of Systems  Constitutional: Negative for chills, fatigue and fever.  Cardiovascular: Negative for chest pain and palpitations.  Gastrointestinal: Positive for abdominal pain. Negative for   constipation, diarrhea, nausea and vomiting.  Genitourinary: Positive for dysuria, enuresis, frequency and urgency. Negative for decreased urine volume, flank pain, hematuria, pelvic pain, vaginal bleeding, vaginal discharge and vaginal pain.  Neurological: Negative for weakness.  Psychiatric/Behavioral: Negative for confusion.  All other systems reviewed and are negative.       Objective:    BP 139/84   Pulse 92   Temp (!) 97.2 F (36.2 C) (Oral)   Ht 5' 2" (1.575 m)   Wt 128 lb (58.1 kg)   LMP 05/23/2013 (Approximate)   BMI 23.41 kg/m    Wt Readings from Last 3 Encounters:  11/27/18 128 lb (58.1 kg)  10/26/18 127 lb 9.6 oz (57.9 kg)  10/24/17 128 lb (58.1 kg)     Physical Exam  Constitutional: She is oriented to person, place, and time. She appears well-developed and well-nourished. She is cooperative. No distress.  HENT:  Head: Normocephalic and atraumatic.  Cardiovascular: Normal rate, regular rhythm and normal heart sounds. Exam reveals no gallop and no friction rub.  No murmur heard. Pulmonary/Chest: Effort normal and breath sounds normal. No respiratory distress.  Abdominal: Soft. Bowel sounds are normal. She exhibits no distension and no mass. There is no hepatosplenomegaly. There is tenderness in the suprapubic area. There is no rebound, no guarding, no CVA tenderness, no tenderness at McBurney's point and negative Murphy's sign. No hernia.  Neurological: She is alert and oriented to person, place, and time.  Skin: Skin is warm and dry. Capillary refill takes less than 2 seconds.  Psychiatric: She has a normal mood and affect. Her behavior is normal. Judgment and thought content normal.  Nursing note and vitals reviewed.   Results for orders placed or performed in visit on 10/26/18  [facility]  Result Value Ref Range   Glucose 92 65 - 99 mg/dL   BUN 13 6 - 24 mg/dL   Creatinine, Ser 0.84 0.57 - 1.00 mg/dL   GFR calc non Af Amer 78 >59 mL/min/1.73   GFR calc Af Amer 90 >59 mL/min/1.73   BUN/Creatinine Ratio 15 9 - 23   Sodium 139 134 - 144 mmol/L   Potassium 4.2 3.5 - 5.2 mmol/L   Chloride 99 96 - 106 mmol/L   CO2 24 20 - 29 mmol/L   Calcium 10.0 8.7 - 10.2 mg/dL   Urinalysis positive for 2+ leukocytes, 2+ blood, and negative for nitrites.  Pertinent labs & imaging results that were available during my care of the patient were reviewed by me and considered in my medical decision making.  Assessment & Plan:  [name] was seen today for urinary urgency and dysuria.  Diagnoses and all orders for this visit:  Dysuria -     Urinalysis, Complete  Cystitis with hematuria Increase water intake. May take Azo over the counter for  three days. Avoid bladder irritants. Medications as prescribed. Culture pending. -     Urinalysis, Complete -     nitrofurantoin, macrocrystal-monohydrate, (MACROBID) 100 MG capsule; Take 1 capsule (100 mg total) by mouth 2 (two) times daily for 7 days. -     Urine Culture      Continue all other maintenance medications.  Follow up plan: Return in about 4 weeks (around 12/25/2018), or if symptoms worsen or fail to improve.  Educational handout given for urinary tract infection   The above assessment and management plan was discussed with the patient. The patient verbalized understanding of and has agreed to the management plan. Patient is aware to call  the clinic if symptoms persist or worsen. Patient is aware when to return to the clinic for a follow-up visit. Patient educated on when it is appropriate to go to the emergency department.   Michelle Rakes, FNP-C [facility] [phone]  

## 2018-11-29 LAB — URINE CULTURE

## 2018-12-08 ENCOUNTER — Encounter

## 2018-12-08 LAB — HM MAMMOGRAPHY

## 2019-02-03 MED ORDER — GENERIC EXTERNAL MEDICATION
10.00 | Status: DC
Start: ? — End: 2019-02-03

## 2019-02-03 MED ORDER — SODIUM CHLORIDE 0.9 % IV SOLN
10.00 | INTRAVENOUS | Status: DC
Start: ? — End: 2019-02-03

## 2019-02-05 ENCOUNTER — Ambulatory Visit (INDEPENDENT_AMBULATORY_CARE_PROVIDER_SITE_OTHER): Admitting: Family Medicine

## 2019-02-05 ENCOUNTER — Encounter: Payer: Self-pay | Admitting: Family Medicine

## 2019-02-05 VITALS — BP 118/74 | Temp 97.3°F | Ht 62.0 in | Wt 127.8 lb

## 2019-02-05 DIAGNOSIS — E876 Hypokalemia: Secondary | ICD-10-CM | POA: Diagnosis not present

## 2019-02-05 DIAGNOSIS — R29818 Other symptoms and signs involving the nervous system: Secondary | ICD-10-CM | POA: Diagnosis not present

## 2019-02-05 DIAGNOSIS — R42 Dizziness and giddiness: Secondary | ICD-10-CM

## 2019-02-05 MED ORDER — MECLIZINE HCL 12.5 MG PO TABS: 12.5000 mg | ORAL_TABLET | Freq: Three times a day (TID) | ORAL | 0 refills | Status: DC | PRN

## 2019-02-05 NOTE — Progress Notes (Signed)
Subjective:     Shelby Skinner is a 58 y.o. female who presents for evaluation of dizziness. The symptoms started 3 days ago and are ongoing. The attacks occur intermittently and last a few seconds. Positions that worsen symptoms: none. Previous workup/treatments: blood work: revealed hypokalemia. Associated ear symptoms: none. Associated CNS symptoms: confusion, headaches, personality change and expressive aphasia. Recent infections: none. Head trauma: denied. Drug ingestion: none. Noise exposure: no occupational exposure. Family history: non-contributory. States she did eat a jelly mushroom and thought this could be the cause. Was seen at [facility] ED on 02/02/2019 and had a negative cardiac workup. Noted hypokalemia, 3.4 during this visit. TSH was normal. No head CT was completed during this visit. Pt states she has ongoing intermittent dizziness lasting only several seconds and intermittent brain fog. States this is slowly improving, but she is still having symptoms.   The following portions of the patient's history were reviewed and updated as appropriate: allergies, current medications, past family history, past medical history, past social history, past surgical history and problem list.  Review of Systems Constitutional: positive for fatigue Eyes: negative Ears, nose, mouth, throat, and face: negative Respiratory: negative Cardiovascular: positive for palpitations Gastrointestinal: negative Genitourinary:negative Musculoskeletal:negative Neurological: positive for dizziness, headaches and confusion Behavioral/Psych: negative    Objective:    BP 118/74   Temp (!) 97.3 F (36.3 C) (Oral)   Ht _0  (1.575 m)   Wt 127 lb 12.8 oz (58 kg)   LMP 05/23/2013 (Approximate)   BMI 23.37 kg/m  General appearance: alert, cooperative, appears stated age and no distress Head: Normocephalic, without obvious abnormality, atraumatic Eyes: conjunctivae/corneas clear. PERRL, EOM's intact.  Fundi benign. Ears: normal TM's and external ear canals both ears Nose: Nares normal. Septum midline. Mucosa normal. No drainage or sinus tenderness. Throat: lips, mucosa, and tongue normal; teeth and gums normal Neck: no adenopathy, no carotid bruit, no JVD, supple, symmetrical, trachea midline and thyroid not enlarged, symmetric, no tenderness/mass/nodules Lungs: clear to auscultation bilaterally Heart: regular rate and rhythm, S1, S2 normal, no murmur, click, rub or gallop Extremities: extremities normal, atraumatic, no cyanosis or edema Pulses: 2+ and symmetric Skin: Skin color, texture, turgor normal. No rashes or lesions Neurologic: Alert and oriented X 3, normal strength and tone. Normal symmetric reflexes. Normal coordination and gait Cranial nerves: normal      Assessment:   Ross was seen today for trouble concentrating, shaking, dizziness and memory loss.  Diagnoses and all orders for this visit:  Dizziness Pt aware of signs and symptoms that require emergent treatment. Medications as prescribed. Report any new or worsening symptoms. CT of head ordered. Referral to Neurology for ongoing symptoms.  -     meclizine (ANTIVERT) 12.5 MG tablet; Take 1 tablet (12.5 mg total) by mouth 3 (three) times daily as needed for dizziness. -     Ambulatory referral to Neurology -     CT Head Wo Contrast; Future  Hypokalemia Labs pending -     BMP8+EGFR     Plan:    Meclizine per medication orders. Referral to Neurology. Educational materials given and questions answered. Follow up in 1 week.    The above assessment and management plan was discussed with the patient. The patient verbalized understanding of and has agreed to the management plan. Patient is aware to call the clinic if symptoms fail to improve or worsen. Patient is aware when to return to the clinic for a follow-up visit. Patient educated on when it  is appropriate to go to the emergency department.   Monia Pouch,  FNP-C [facility] [address] [place], [place] [address] [phone]

## 2019-02-05 NOTE — Patient Instructions (Addendum)

## 2019-02-06 LAB — BMP8+EGFR
BUN/Creatinine Ratio: 10 (ref 9–23)
BUN: 8 mg/dL (ref 6–24)
CO2: 24 mmol/L (ref 20–29)
Calcium: 10 mg/dL (ref 8.7–10.2)
Chloride: 102 mmol/L (ref 96–106)
Creatinine, Ser: 0.84 mg/dL (ref 0.57–1.00)
GFR calc Af Amer: 90 mL/min/{1.73_m2} (ref >59)
GFR, EST NON AFRICAN AMERICAN: 78 mL/min/{1.73_m2} (ref >59)
Glucose: 82 mg/dL (ref 65–99)
Potassium: 4.3 mmol/L (ref 3.5–5.2)
Sodium: 143 mmol/L (ref 134–144)

## 2019-02-09 ENCOUNTER — Telehealth: Payer: Self-pay | Admitting: Family Medicine

## 2019-02-09 NOTE — Telephone Encounter (Signed)
Pt aware of appointment date/time; We will try to get her scheduled sooner at any facility

## 2019-02-10 ENCOUNTER — Ambulatory Visit (HOSPITAL_COMMUNITY)
Admission: RE | Admit: 2019-02-10 | Discharge: 2019-02-10 | Disposition: A | Discharge status: Home / Self Care | Source: Ambulatory Visit | Attending: Family Medicine | Admitting: Family Medicine

## 2019-02-10 DIAGNOSIS — R29818 Other symptoms and signs involving the nervous system: Secondary | ICD-10-CM | POA: Diagnosis not present

## 2019-02-10 DIAGNOSIS — R42 Dizziness and giddiness: Secondary | ICD-10-CM | POA: Diagnosis present

## 2019-02-17 ENCOUNTER — Ambulatory Visit (HOSPITAL_COMMUNITY): Payer: Self-pay

## 2019-02-23 ENCOUNTER — Ambulatory Visit: Admitting: Family

## 2019-02-23 ENCOUNTER — Encounter: Payer: Self-pay | Admitting: Family Medicine

## 2019-02-23 ENCOUNTER — Ambulatory Visit (INDEPENDENT_AMBULATORY_CARE_PROVIDER_SITE_OTHER): Admitting: Family Medicine

## 2019-02-23 VITALS — BP 125/77 | HR 65 | Temp 97.2°F | Ht 62.0 in | Wt 127.0 lb

## 2019-02-23 DIAGNOSIS — R002 Palpitations: Secondary | ICD-10-CM

## 2019-02-23 DIAGNOSIS — M479 Spondylosis, unspecified: Secondary | ICD-10-CM

## 2019-02-23 NOTE — Patient Instructions (Signed)
Spondylolysis  Spondylolysis is a small break or crack (stress fracture) in a bone in the spine (vertebra) in the lower back (lumbar spine). The stress fracture occurs on the bony mass between and behind the vertebra. Spondylolysis may be caused by an injury (trauma) or by overuse. Since the lower back is almost always under pressure from daily living, this stress fracture usually does not heal normally. Spondylolysis may eventually cause one vertebra to slip forward and out of place (spondylolisthesis). What are the causes? This condition may be caused by:  Trauma, such as a fall.  Excessive wear and tear. This is often a result of doing sports or physical activities that involve repetitive overstretching (hyperextension) and rotation of the spine. What increases the risk?  You are more likely to develop this condition if you have: ? A family history of this condition. ? An inward curvature of your spine (lordosis). ? A condition that affects your spine, such as spina bifida. You are more likely to develop this condition if you participate in:  Gymnastics.  Dance.  Football.  Wrestling.  Martial arts.  Weight lifting.  Tennis.  Swimming. What are the signs or symptoms? Symptoms of this condition may include:  Long-lasting (chronic) pain in the lower back.  Stiffness in the back or legs.  Tightness in the hamstring muscles, which are in the backs of the thighs. In some cases, there may be no symptoms of this condition. How is this diagnosed? This condition may be diagnosed based on:  Your symptoms.  Your medical history.  A physical exam.  Imaging tests, such as: ? X-rays. ? CT scan. ? MRI. How is this treated? This condition may be treated by:  Resting. You may be asked to avoid or modify activities that put strain on your back until your symptoms improve.  Medicines to help relieve pain.  NSAIDs to help reduce swelling and discomfort.  Injections of  medicine (cortisone) in your back. These injections can help to relieve pain and numbness.  A brace to stabilize and support your back.  Physical therapy. You may work with an occupational therapist or physical therapist who can teach you how to reduce pressure on your back while you do everyday activities.  Surgery. This may be needed if you have: ? A severe injury. ? Pain that lasts for more than 6 months. ? Numbness in your pelvic region. ? Changes in control of your stool or urine. Follow these instructions at home: Medicines  Take over-the-counter and prescription medicines only as told by your health care provider.  Ask your health care provider if the medicine prescribed to you: ? Requires you to avoid driving or using heavy machinery. ? Can cause constipation. You may need to take these actions to prevent or treat constipation:  Drink enough fluid to keep your urine pale yellow.  Take over-the-counter or prescription medicines.  Eat foods that are high in fiber, such as beans, whole grains, and fresh fruits and vegetables.  Limit foods that are high in fat and processed sugars, such as fried or sweet foods. If you have a brace:  Wear the brace as told by your health care provider. Remove it only as told by your health care provider.  Keep the brace clean.  If the brace is not waterproof: ? Do not let it get wet. ? Cover it with a watertight covering when you take a bath or a shower. Activity  Rest and return to your normal activities as told   by your health care provider. Ask your health care provider what activities are safe for you.  Ask your health care provider when it is safe to drive if you have a back brace.  Work with a physical therapist to make a safe exercise program, as recommended by your health care provider. Do exercises as told by your physical therapist. This may include exercises to strengthen your back and abdominal muscles (core  exercises). Managing pain, stiffness, and swelling      If directed, put ice on the affected area. ? If you have a removable brace, remove it as told by your health care provider. ? Put ice in a plastic bag. ? Place a towel between your skin and the bag. ? Leave the ice on for 20 minutes, 2-3 times a day.  If directed, apply heat to the affected area as often as told by your health care provider. Use the heat source that your health care provider recommends, such as a moist heat pack or a heating pad. ? If you have a removable brace, remove it as told by your health care provider. ? Place a towel between your skin and the heat source. ? Leave the heat on for 20-30 minutes. ? Remove the heat if your skin turns bright red. This is especially important if you are unable to feel pain, heat, or cold. You may have a greater risk of getting burned. General instructions  Do not use any products that contain nicotine or tobacco, such as cigarettes, e-cigarettes, and chewing tobacco. These can delay bone healing. If you need help quitting, ask your health care provider.  Maintain a healthy weight. Extra weight puts stress on your back.  Keep all follow-up visits as told by your health care provider. This is important. Contact a health care provider if:  You have pain that gets worse or does not get better. Get help right away if:  You have severe back pain.  You have changes in control of your stool or urine.  You develop weakness or numbness in your legs.  You are unable to stand or walk. Summary  Spondylolysis is a small break or crack (stress fracture) in a bone in the spine (vertebra) in the lower back (lumbar spine).  This condition may be treated by resting, medicines, physical therapy, wearing a brace, or surgery.  Rest and return to your normal activities as told by your health care provider. Ask your health care provider what activities are safe for you.  Contact a health  care provider if you have pain that gets worse or does not get better. This information is not intended to replace advice given to you by your health care provider. Make sure you discuss any questions you have with your health care provider. Document Released: 12/09/2005 Document Revised: 07/14/2018 Document Reviewed: 07/14/2018 Elsevier Interactive Patient Education  2019 [employer].

## 2019-02-23 NOTE — Progress Notes (Signed)
Subjective:  Patient ID: Shelby Skinner, female    DOB: [birth year], 58 y.o.   MRN: [id number]  Chief Complaint:  Follow up dizziness   HPI: Shelby Skinner is a 58 y.o. female presenting on 02/23/2019 for Follow up dizziness  Pt presents today for follow up for dizziness and possible reaction to mushrooms. She reports she is feeling much better but not 100%. States the dizziness has subsided but she continue to have intermittent palpitations. Pt states the palpitations are less frequent and less severe. She denies chest pain, shortness of breath, or syncope. She does not have jaw or neck pain. No diaphoresis or nausea. She had a negative cardiac workup in the ED when symptoms started. She had a negative head CT. She has follow up scheduled with neurology.  She also reports ongoing back pain due to spondylosis. States she has been seeing a Restaurant manager, fast food for several years. States she has tried PT in the past and would like to go again. She denies loss of function, no loss of bowel or bladder function, no numbness or tingling.    Relevant past medical, surgical, family, and social history reviewed and updated as indicated.  Allergies and medications reviewed and updated.   History reviewed. No pertinent past medical history.  Past Surgical History:  Procedure Laterality Date  . APPENDECTOMY  2010    Social History   Socioeconomic History  . Marital status: Married    Spouse name: Not on file  . Number of children: Not on file  . Years of education: Not on file  . Highest education level: Not on file  Occupational History  . Not on file  Social Needs  . Financial resource strain: Not on file  . Food insecurity:    Worry: Not on file    Inability: Not on file  . Transportation needs:    Medical: Not on file    Non-medical: Not on file  Tobacco Use  . Smoking status: Never Smoker  . Smokeless tobacco: Never Used  Substance and Sexual Activity  . Alcohol use: Yes   Alcohol/week: 1.0 standard drinks    Types: 1 Glasses of wine per week  . Drug use: Not on file  . Sexual activity: Not on file  Lifestyle  . Physical activity:    Days per week: Not on file    Minutes per session: Not on file  . Stress: Not on file  Relationships  . Social connections:    Talks on phone: Not on file    Gets together: Not on file    Attends religious service: Not on file    Active member of club or organization: Not on file    Attends meetings of clubs or organizations: Not on file    Relationship status: Not on file  . Intimate partner violence:    Fear of current or ex partner: Not on file    Emotionally abused: Not on file    Physically abused: Not on file    Forced sexual activity: Not on file  Other Topics Concern  . Not on file  Social History Narrative  . Not on file    Outpatient Encounter Medications as of 02/23/2019  Medication Sig  . cholecalciferol (VITAMIN D) 1000 units tablet Take 1,000 Units by mouth daily.  . vitamin C (ASCORBIC ACID) 500 MG tablet Take 500 mg by mouth daily.  . [DISCONTINUED] cyanocobalamin 100 MCG tablet Take 100 mcg by mouth daily.  . meclizine (ANTIVERT)  12.5 MG tablet Take 1 tablet (12.5 mg total) by mouth 3 (three) times daily as needed for dizziness. (Patient not taking: Reported on 02/23/2019)   No facility-administered encounter medications on file as of 02/23/2019.     Allergies  Allergen Reactions  . Codeine Nausea Only    Review of Systems  Constitutional: Negative for activity change, appetite change, fatigue, fever and unexpected weight change.  Eyes: Negative for photophobia and visual disturbance.  Respiratory: Negative for cough, chest tightness, shortness of breath and stridor.   Cardiovascular: Positive for palpitations. Negative for chest pain and leg swelling.  Gastrointestinal: Negative for abdominal pain, nausea and vomiting.  Musculoskeletal: Positive for arthralgias.  Neurological: Negative for  dizziness, tremors, seizures, syncope, facial asymmetry, speech difficulty, weakness, light-headedness, numbness and headaches.  Psychiatric/Behavioral: Negative for confusion.  All other systems reviewed and are negative.       Objective:  BP 125/77   Pulse 65   Temp (!) 97.2 F (36.2 C) (Oral)   Ht _0  (1.575 m)   Wt 127 lb (57.6 kg)   LMP 05/23/2013 (Approximate)   BMI 23.23 kg/m    Wt Readings from Last 3 Encounters:  02/23/19 127 lb (57.6 kg)  02/05/19 127 lb 12.8 oz (58 kg)  11/27/18 128 lb (58.1 kg)    Physical Exam Vitals signs and nursing note reviewed.  Constitutional:      General: She is not in acute distress.    Appearance: Normal appearance. She is not ill-appearing or toxic-appearing.  HENT:     Head: Normocephalic and atraumatic.     Mouth/Throat:     Mouth: Mucous membranes are moist.  Eyes:     Conjunctiva/sclera: Conjunctivae normal.     Pupils: Pupils are equal, round, and reactive to light.  Neck:     Musculoskeletal: Normal range of motion and neck supple.  Cardiovascular:     Rate and Rhythm: Normal rate and regular rhythm.     Heart sounds: Normal heart sounds. No murmur. No friction rub. No gallop.   Pulmonary:     Effort: Pulmonary effort is normal. No respiratory distress.     Breath sounds: Normal breath sounds.  Abdominal:     General: Abdomen is flat.     Palpations: Abdomen is soft.     Tenderness: There is no abdominal tenderness.  Skin:    General: Skin is warm and dry.     Capillary Refill: Capillary refill takes less than 2 seconds.  Neurological:     General: No focal deficit present.     Mental Status: She is alert and oriented to person, place, and time.     Cranial Nerves: No cranial nerve deficit.     Sensory: No sensory deficit.     Motor: No weakness.     Coordination: Coordination normal.     Gait: Gait normal.     Deep Tendon Reflexes: Reflexes normal.  Psychiatric:        Mood and Affect: Mood normal.         Behavior: Behavior normal.        Thought Content: Thought content normal.        Judgment: Judgment normal.     Results for orders placed or performed in visit on 02/05/19  [facility]  Result Value Ref Range   Glucose 82 65 - 99 mg/dL   BUN 8 6 - 24 mg/dL   Creatinine, Ser 0.84 0.57 - 1.00 mg/dL   GFR calc non Af  Amer 78 >59 mL/min/1.73   GFR calc Af Amer 90 >59 mL/min/1.73   BUN/Creatinine Ratio 10 9 - 23   Sodium 143 134 - 144 mmol/L   Potassium 4.3 3.5 - 5.2 mmol/L   Chloride 102 96 - 106 mmol/L   CO2 24 20 - 29 mmol/L   Calcium 10.0 8.7 - 10.2 mg/dL       Pertinent labs & imaging results that were available during my care of the patient were reviewed by me and considered in my medical decision making.  Assessment & Plan:  Nandika was seen today for follow up dizziness.  Diagnoses and all orders for this visit:  Spondylosis Has been seeing chiropractor for years, would like referral to PT for strengthening and stretching.  -     Ambulatory referral to Physical Therapy  Palpitations No associated chest pain, syncope, or shortness of breath. Symptoms has decreased in frequency and severity. Had a negative cardiac work up in the ED and a normal TSH. If symptoms persist or worsen, will refer to cardiology.     Continue all other maintenance medications.  Follow up plan: Return in about 4 weeks (around 03/23/2019), or if symptoms worsen or fail to improve, for CPE.  Educational handout given for spondylosis   The above assessment and management plan was discussed with the patient. The patient verbalized understanding of and has agreed to the management plan. Patient is aware to call the clinic if symptoms persist or worsen. Patient is aware when to return to the clinic for a follow-up visit. Patient educated on when it is appropriate to go to the emergency department.   Monia Pouch, FNP-C [facility] [phone]

## 2019-03-01 ENCOUNTER — Other Ambulatory Visit: Payer: Self-pay

## 2019-03-01 ENCOUNTER — Ambulatory Visit: Attending: Family Medicine | Admitting: Physical Therapy

## 2019-03-01 ENCOUNTER — Encounter: Payer: Self-pay | Admitting: Physical Therapy

## 2019-03-01 DIAGNOSIS — M6281 Muscle weakness (generalized): Secondary | ICD-10-CM | POA: Diagnosis present

## 2019-03-01 DIAGNOSIS — M545 Low back pain, unspecified: Secondary | ICD-10-CM

## 2019-03-01 NOTE — Therapy (Signed)
[facility] [address] [place], [place], [address] Phone: [phone]   Fax:  [phone]  Physical Therapy Evaluation  Patient Details  Name: Shelby Skinner MRN: [id number] Date of Birth: 02-02-1961 Referring Provider (PT): Gilford Silvius, FNP   Encounter Date: 03/01/2019  PT End of Session - 03/01/19 2220    Visit Number  1    Number of Visits  12    Date for PT Re-Evaluation  04/19/19    Authorization Type  Progress note every 10th visit    PT Start Time  0900    PT Stop Time  0946    PT Time Calculation (min)  46 min    Activity Tolerance  Patient tolerated treatment well    Behavior During Therapy  [facility] for tasks assessed/performed       History reviewed. No pertinent past medical history.  Past Surgical History:  Procedure Laterality Date  . APPENDECTOMY  2010    There were no vitals filed for this visit.   Subjective Assessment - 03/01/19 2216    Subjective  Patient arrives to physical therapy with reports of ongoing numbness in left lower lumbar region and second and third toes of the left foot that began over a year ago. Patient reported getting chiropractic care regularly but noted with pressure and numbness in second and third toes in left foot that began about 1 month ago. Patient reported intermittent symptoms but as of 02/28/2019, symptoms remained constant. X-rays performed at her chiropractor's office showed spondylolisthesis at L1 and L4 per patient report. Patient denies pain in lumbar spine but noted with increased burning sensation to left lower lumbar region especially with increased walking, while household chores like mopping and with pushing for work as a Teacher, adult education. Patient reports symptoms improve while lying on her back. Patient was instructed per chiropractor to avoid sleeping on her stomach and avoid planks. Patient's goals are to decrease nerve pressures, return to exercise like Zumba, have less  difficulties with home and work activities, and stand longer.    Limitations  House hold activities;Walking    Diagnostic tests  x-ray performed at chriopractor: spondylolisthesis at L1 and L4    Patient Stated Goals  reduce "nerve pressure points", improve home and work activities    Currently in Pain?  No/denies    Pain Location  Back    Pain Orientation  Left;Lower    Pain Descriptors / Indicators  Burning;Numbness;Tingling    Pain Radiating Towards  2nd and 3rd toes of left foot    Pain Onset  More than a month ago    Pain Frequency  Constant    Aggravating Factors   walking lifting, household chores, work as massage therapist    Pain Relieving Factors  "laying on back sometimes"         [facility] PT Assessment - 03/01/19 0001      Assessment   Medical Diagnosis  Spondylosis    Referring Provider (PT)  Gilford Silvius, FNP    Onset Date/Surgical Date  --   over 1 year ago   Next MD Visit  April    Prior Therapy  no      Precautions   Precautions  None      Restrictions   Weight Bearing Restrictions  No      Balance Screen   Has the patient fallen in the past 6 months  No    Has the patient had a decrease in activity level because of  a fear of falling?   No    Is the patient reluctant to leave their home because of a fear of falling?   No      Home Public house manager residence      Prior Function   Level of Independence  Independent    Vocation  Part time employment    Journalist, newspaper      ROM / Strength   AROM / PROM / Strength  Strength      Strength   Strength Assessment Site  Hip;Knee    Right/Left Hip  Right;Left    Right Hip Flexion  4-/5    Right Hip Extension  4-/5    Right Hip ABduction  4-/5    Left Hip Flexion  3+/5    Left Hip Extension  4-/5    Left Hip ABduction  4-/5    Right/Left Knee  Right;Left    Right Knee Flexion  4/5    Right Knee Extension  4/5    Left Knee Flexion  4/5    Left Knee  Extension  4/5      Palpation   Palpation comment  minimal tenderness to palpation along lumbar paraspinals, sacrum or upper gluteals                Objective measurements completed on examination: See above findings.              PT Education - 03/01/19 2220    Education Details  draw ins, hip abduction, hip adduction    Person(s) Educated  Patient    Methods  Explanation;Handout;Demonstration    Comprehension  Verbalized understanding;Returned demonstration          PT Long Term Goals - 03/01/19 2225      PT LONG TERM GOAL #1   Title  Patient will be independent with HEP    Time  6    Period  Weeks    Status  New      PT LONG TERM GOAL #2   Title  Patient will demonstrate 4+/5 or greater bilateral LE strength to improve stability during functional tasks.    Time  6    Period  Weeks    Status  New      PT LONG TERM GOAL #3   Title  Patient will report ability to perform ADLs, home and work activities with less than 3/10 burning and pressure in low back and 2nd and 3rd toes of left foot.    Time  6    Period  Weeks    Status  New             Plan - 03/01/19 2221    Clinical Impression Statement  Patient is a 58 year old female who presents to physical therapy with bilateral LE weakness. Patient denied tenderness to palpation to lumbar paraspinals, sacral region, and upper gluteals. Patient denied changes to light touch sensation upon assessment. While reviewing supine ab set/draw-ins, patient reported a numbness and tingling sensation in left saddle area, specifically left labia. Patient reported feeling those sensations previously before. Patient stated when performing exercise in sitting, she does not have numbness and tingling. Patient denied having an MRI. Patient denied having sensation with other exercises in HEP. Patient instructed to inform referring physician of symptoms. Patient would benefit from skilled physical therapy to address  deficits and patient's goals.     Personal Factors and Comorbidities  Comorbidity 1    Comorbidities  heart palpitations    Examination-Activity Limitations  Lift;Stand    Examination-Participation Restrictions  Cleaning    Stability/Clinical Decision Making  Evolving/Moderate complexity    Clinical Decision Making  Low    Rehab Potential  Good    PT Frequency  2x / week    PT Duration  6 weeks    PT Treatment/Interventions  ADLs/Self Care Home Management;Electrical Stimulation;Moist Heat;Cryotherapy;Ultrasound;Manual techniques;Dry needling;Passive range of motion;Neuromuscular re-education;Balance training;Gait training;Stair training;Therapeutic exercise;Therapeutic activities    PT Next Visit Plan  FOTO; core stabilization and strengthening, assess lumbar ROM, modalities PRN    PT Home Exercise Plan  see patient education section    Consulted and Agree with Plan of Care  Patient       Patient will benefit from skilled therapeutic intervention in order to improve the following deficits and impairments:  Decreased strength, Pain, Postural dysfunction  Visit Diagnosis: Low back pain, unspecified back pain laterality, unspecified chronicity, unspecified whether sciatica present  Muscle weakness (generalized)     Problem List Patient Active Problem List   Diagnosis Date Noted  . Spondylosis 02/23/2019  . Encounter for screening colonoscopy 03/12/2016    Guss Bunde, PT, DPT 03/01/2019, 10:46 PM  [facility] [address] [place], [place], [address] Phone: [phone]   Fax:  [phone]  Name: Shelby Skinner MRN: [id number] Date of Birth: Oct 26, 1961

## 2019-03-04 ENCOUNTER — Ambulatory Visit: Admitting: Physical Therapy

## 2019-03-04 ENCOUNTER — Other Ambulatory Visit: Payer: Self-pay

## 2019-03-04 DIAGNOSIS — M6281 Muscle weakness (generalized): Secondary | ICD-10-CM

## 2019-03-04 DIAGNOSIS — M545 Low back pain, unspecified: Secondary | ICD-10-CM

## 2019-03-04 NOTE — Therapy (Signed)
[facility] [address] [place], [place], [address] Phone: [phone]   Fax:  [phone]  Physical Therapy Treatment  Patient Details  Name: Liz Bahar MRN: [id number] Date of Birth: Mar 03, 1961 Referring Provider (PT): Gilford Silvius, FNP   Encounter Date: 03/04/2019  PT End of Session - 03/04/19 1351    Visit Number  2    Number of Visits  12    Date for PT Re-Evaluation  04/19/19    Authorization Type  Progress note every 10th visit    PT Start Time  1300    PT Stop Time  1345    PT Time Calculation (min)  45 min    Activity Tolerance  Patient tolerated treatment well    Behavior During Therapy  [facility] for tasks assessed/performed       No past medical history on file.  Past Surgical History:  Procedure Laterality Date  . APPENDECTOMY  2010    There were no vitals filed for this visit.  Subjective Assessment - 03/04/19 1301    Subjective  Patient reports that in the morning she feels a pressure b/w digits 1 and 2.  In the car she feels like a tiny ball under her sits bone. right now minimal tension in the back of right HS.     Currently in Pain?  No/denies                       [facility] Adult PT Treatment/Exercise - 03/04/19 0001      Exercises   Exercises  Lumbar      Lumbar Exercises: Stretches   Quad Stretch  Left;1 rep;20 seconds   SDLY for HF   Quad Stretch Limitations  various hip flexor stretches attempted; this one best      Lumbar Exercises: Supine   Ab Set  5 reps;5 seconds    Clam  10 reps    Heel Slides  20 reps    Bent Knee Raise  20 reps    Other Supine Lumbar Exercises  up/up/down/down march x 5 each way; table top bicycle     Lumbar Exercises: Prone   Other Prone Lumbar Exercises  attempted pelvic press series - increases sx into peroneal      Lumbar Exercises: Quadruped   Straight Leg Raise  10 reps    Plank  1 rep x15 sec, side planks x 5 sec ea             PT  Education - 03/04/19 1351    Education Details  HEP    Person(s) Educated  Patient    Methods  Explanation;Demonstration;Handout    Comprehension  Verbalized understanding;Returned demonstration          PT Long Term Goals - 03/01/19 2225      PT LONG TERM GOAL #1   Title  Patient will be independent with HEP    Time  6    Period  Weeks    Status  New      PT LONG TERM GOAL #2   Title  Patient will demonstrate 4+/5 or greater bilateral LE strength to improve stability during functional tasks.    Time  6    Period  Weeks    Status  New      PT LONG TERM GOAL #3   Title  Patient will report ability to perform ADLs, home and work activities with less than 3/10 burning and pressure in low back  and 2nd and 3rd toes of left foot.    Time  6    Period  Weeks    Status  New            Plan - 03/04/19 1359    Clinical Impression Statement  Patient did very well with TE today and demos good base strength for core. She did have increased sx with prone pelvic press with pillow under her belly so we progressed to quadriped. She was able to plank and side planks without sx. HEP was progressed.    PT Treatment/Interventions  ADLs/Self Care Home Management;Electrical Stimulation;Moist Heat;Cryotherapy;Ultrasound;Manual techniques;Dry needling;Passive range of motion;Neuromuscular re-education;Balance training;Gait training;Stair training;Therapeutic exercise;Therapeutic activities    PT Next Visit Plan  core stabilization and strengthening, assess lumbar ROM, modalities PRN    PT Home Exercise Plan  see patient education section; PQHDHQZM (quad alt leg; plank; side plank; SDLy quad stretch/HF       Patient will benefit from skilled therapeutic intervention in order to improve the following deficits and impairments:  Decreased strength, Pain, Postural dysfunction  Visit Diagnosis: Low back pain, unspecified back pain laterality, unspecified chronicity, unspecified whether sciatica  present  Muscle weakness (generalized)     Problem List Patient Active Problem List   Diagnosis Date Noted  . Spondylosis 02/23/2019  . Encounter for screening colonoscopy 03/12/2016    Solon Palm PT 03/04/2019, 3:31 PM  [facility] [address] [place], [place], [address] Phone: [phone]   Fax:  [phone]  Name: Tykera Canelo MRN: [id number] Date of Birth: February 05, 1961

## 2019-03-04 NOTE — Patient Instructions (Signed)
Access Code: [email or web address]  URL: [email or web address]  Date: 03/04/2019  Prepared by: Raynelle Fanning Maeryn Mcgath   Exercises  Quadruped Alternating Leg Extensions - 10 reps - 3 sets - 1x daily - 7x weekly  Standard Plank - 5 reps - 1 sets - max hold - 2x daily - 7x weekly  Side Plank on Elbow - 5 reps - 1 sets - max hold - 1x daily - 7x weekly  Sidelying Quadriceps Stretch - 3 reps - 1 sets - 30-60 seconds hold - 2x daily - 7x weekly  Supine Single Knee to Chest - 5 reps - 1 sets - 20 sec hold - 2x daily - 7x weekly

## 2019-03-09 ENCOUNTER — Other Ambulatory Visit: Payer: Self-pay

## 2019-03-09 ENCOUNTER — Encounter: Payer: Self-pay | Admitting: Physical Therapy

## 2019-03-09 ENCOUNTER — Ambulatory Visit: Admitting: Physical Therapy

## 2019-03-09 DIAGNOSIS — M545 Low back pain, unspecified: Secondary | ICD-10-CM

## 2019-03-09 DIAGNOSIS — M6281 Muscle weakness (generalized): Secondary | ICD-10-CM

## 2019-03-09 NOTE — Therapy (Addendum)
[facility] [place], [place], [address] Phone: [phone]   Fax:  [phone]  Physical Therapy Treatment PHYSICAL THERAPY DISCHARGE SUMMARY  Visits from Start of Care: 3  Current functional level related to goals / functional outcomes: See below   Remaining deficits: See goals   Education / Equipment: HEP Plan: Patient agrees to discharge.  Patient goals were not met. Patient is being discharged due to not returning since the last visit.  ?????  Gabriela Eves, PT, DPT 12/04/20   Patient Details  Name: Shelby Skinner MRN: [id number] Date of Birth: 12/07/1961 Referring Provider (PT): Darla Lesches, FNP   Encounter Date: 03/09/2019  PT End of Session - 03/09/19 1754    Visit Number  3    Number of Visits  12    Date for PT Re-Evaluation  04/19/19    Authorization Type  Progress note every 10th visit    PT Start Time  0438    PT Stop Time  0540    PT Time Calculation (min)  62 min       History reviewed. No pertinent past medical history.  Past Surgical History:  Procedure Laterality Date  . APPENDECTOMY  2010    There were no vitals filed for this visit.  Subjective Assessment - 03/09/19 1644    Subjective  The last few days have been rough.    Diagnostic tests  x-ray performed at [facility]ri[facility]ractor: spondylolisthesis at L1 and L4    Patient Stated Goals  reduce "nerve pressure points", improve home and work activities    Pain Orientation  Left;Lower    Pain Descriptors / Indicators  Burning;Numbness;Tingling    Pain Onset  More than a month ago                       [facility] Adult PT Treatment/Exercise - 03/09/19 0001      Exercises   Exercises  Lumbar;Knee/Hip      Lumbar Exercises: Aerobic   Elliptical  L1/L1 x 10 minutes (5 minutes forward and5 minutes backward).      Lumbar Exercises: Supine   AB Set Limitations  Mini-crunches to fatigue x 2.    Bridge Limitations  Hip bridges  to fatigue f/b hip bridges with steps to fatigue.    Other Supine Lumbar Exercises  Swiss ball crunches to fatigue.      Lumbar Exercises: Prone   Other Prone Lumbar Exercises  Prone over pillow:  "Mouse under house" to fatigue.    Other Prone Lumbar Exercises  Prone left legs with pillow under abdomen.  Leg lifts to neutral.      Modalities   Modalities  Ultrasound      Ultrasound   Ultrasound Location  Left low back   Pt in RT sdly position with pillow between knees.   Ultrasound Parameters  U/S at 1.50 W/CM2 x 10 minutes.      Manual Therapy   Manual Therapy  Soft tissue mobilization    Soft tissue mobilization  Right sdly position with pillow between knees:  STW/M including left QL release  x 10 minutes.                  PT Long Term Goals - 03/01/19 2225      PT LONG TERM GOAL #1   Title  Patient will be independent with HEP    Time  6    Period  Weeks    Status  New      PT LONG TERM GOAL #2   Title  Patient will demonstrate 4+/5 or greater bilateral LE strength to improve stability during functional tasks.    Time  6    Period  Weeks    Status  New      PT LONG TERM GOAL #3   Title  Patient will report ability to perform ADLs, home and work activities with less than 3/10 burning and pressure in low back and 2nd and 3rd toes of left foot.    Time  6    Period  Weeks    Status  New            Plan - 03/09/19 1751    Clinical Impression Statement  Patient states the last few days have been rough.  She did, howevere, do well with exercise progression today.  She is highly motivated.  Patient states she has an appointment with a Neurologist in April which is an excellent idea considering her ongoing LE symptoms and reported quite easy to flare-up.    PT Treatment/Interventions  ADLs/Self Care Home Management;Electrical Stimulation;Moist Heat;Cryotherapy;Ultrasound;Manual techniques;Dry needling;Passive range of motion;Neuromuscular re-education;Balance  training;Gait training;Stair training;Therapeutic exercise;Therapeutic activities    PT Next Visit Plan  core stabilization and strengthening, assess lumbar ROM, modalities PRN    PT Home Exercise Plan  see patient education section; PQHDHQZM (quad alt leg; plank; side plank; SDLy quad stretch/HF    Consulted and Agree with Plan of Care  Patient       Patient will benefit from skilled therapeutic intervention in order to improve the following deficits and impairments:     Visit Diagnosis: Low back pain, unspecified back pain laterality, unspecified chronicity, unspecified whether sciatica present  Muscle weakness (generalized)     Problem List Patient Active Problem List   Diagnosis Date Noted  . Spondylosis 02/23/2019  . Encounter for screening colonoscopy 03/12/2016    Chuckie Mccathern, [place] MPT 03/09/2019, 6:21 PM  [facility] [place], [place], [address] Phone: [phone]   Fax:  [phone]  Name: Shelby Skinner MRN: [id number] Date of Birth: Apr 30, 1961  PHYSICAL THERAPY DISCHARGE SUMMARY  Visits from Start of Care: 3.  Current functional level related to goals / functional outcomes: See above.   Remaining deficits: See below.   Education / Equipment:  Plan: Patient agrees to discharge.  Patient goals were not met. Patient is being discharged due to not returning since the last visit.  ?????         Mali Zaryan Yakubov MPT

## 2019-03-12 ENCOUNTER — Encounter: Admitting: Physical Therapy

## 2019-03-23 ENCOUNTER — Telehealth: Payer: Self-pay

## 2019-03-23 NOTE — Telephone Encounter (Signed)
Spoke with the patient to go over PMH, Allergies and Medications before her Virtual Visit with Dr. Marjory Lies tomorrow 03/24/2019 at 1:30 pm. Everything has been update accordingly.

## 2019-03-24 ENCOUNTER — Encounter: Payer: Self-pay | Admitting: Diagnostic Neuroimaging

## 2019-03-24 ENCOUNTER — Other Ambulatory Visit: Payer: Self-pay

## 2019-03-24 ENCOUNTER — Ambulatory Visit (INDEPENDENT_AMBULATORY_CARE_PROVIDER_SITE_OTHER): Admitting: Diagnostic Neuroimaging

## 2019-03-24 ENCOUNTER — Encounter

## 2019-03-24 DIAGNOSIS — R42 Dizziness and giddiness: Secondary | ICD-10-CM | POA: Diagnosis not present

## 2019-03-24 DIAGNOSIS — R4689 Other symptoms and signs involving appearance and behavior: Secondary | ICD-10-CM

## 2019-03-24 DIAGNOSIS — R2689 Other abnormalities of gait and mobility: Secondary | ICD-10-CM

## 2019-03-24 NOTE — Progress Notes (Signed)
[facility]  PATIENT: Shelby Skinner DOB: [birth year]  REFERRING CLINICIAN: Francene Finders, FNP HISTORY FROM: patient REASON FOR VISIT: new consult (via video)   HISTORICAL  CHIEF COMPLAINT:  Chief Complaint  Patient presents with  . Dizziness  . other    abnl spell    HISTORY OF PRESENT ILLNESS:   58 year old female here for evaluation of abnormal spells.  02/02/2019 patient was at home, had just eaten, then developed chest pain, palpitations, tremor.  She was having some word finding difficulties and confusion.  For previous few weeks before this event patient also was "acting differently" according to her family.  Patient also had intermittent balance and gait difficulty.  Symptoms have improved.  No vision changes.  No ongoing bowel or bladder incontinence.  Patient had CT scan of the head which was unremarkable.  In the past patient has had some issues with perineal numbness and prickly sensation.  She has had some vertigo spells in the past.  Patient has family history of multiple sclerosis in her mother, and patient is concerned about similar diagnosis for self.    REVIEW OF SYSTEMS: Full 14 system review of systems performed and negative with exception of: As per HPI.  ALLERGIES: Allergies  Allergen Reactions  . Codeine Nausea Only  . Gluten Meal Swelling, Palpitations and Other (See Comments)    GI issues  . Mushroom Extract Complex Nausea Only, Palpitations and Other (See Comments)    Jelly mushrooms. Tremors, dry mouth and dizziness.     HOME MEDICATIONS: Outpatient Medications Prior to Visit  Medication Sig Dispense Refill  . cholecalciferol (VITAMIN D) 1000 units tablet Take 1,000 Units by mouth daily.    . Cyanocobalamin ([employer]S B12 GUMMIES PO) Take 1 tablet by mouth daily.    . meclizine (ANTIVERT) 12.5 MG tablet Take 1 tablet (12.5 mg total) by mouth 3 (three) times daily as needed for dizziness. (Patient not taking: Reported on 02/23/2019) 30  tablet 0  . Omega-3 Fatty Acids (CVS FISH OIL PO) Take 1 tablet by mouth daily.    . vitamin C (ASCORBIC ACID) 500 MG tablet Take 1,000 mg by mouth daily.      No facility-administered medications prior to visit.     PAST MEDICAL HISTORY: No past medical history on file.  PAST SURGICAL HISTORY: Past Surgical History:  Procedure Laterality Date  . APPENDECTOMY  2010    FAMILY HISTORY: Family History  Problem Relation Age of Onset  . Multiple sclerosis Mother   . Crohn's disease Mother   . Hypertension Father   . Cancer Sister     SOCIAL HISTORY: Social History   Socioeconomic History  . Marital status: Married    Spouse name: Not on file  . Number of children: Not on file  . Years of education: Not on file  . Highest education level: Not on file  Occupational History  . Not on file  Social Needs  . Financial resource strain: Not on file  . Food insecurity:    Worry: Not on file    Inability: Not on file  . Transportation needs:    Medical: Not on file    Non-medical: Not on file  Tobacco Use  . Smoking status: Never Smoker  . Smokeless tobacco: Never Used  Substance and Sexual Activity  . Alcohol use: Yes    Alcohol/week: 1.0 standard drinks    Types: 1 Glasses of wine per week  . Drug use: Not on file  . Sexual  activity: Not on file  Lifestyle  . Physical activity:    Days per week: Not on file    Minutes per session: Not on file  . Stress: Not on file  Relationships  . Social connections:    Talks on phone: Not on file    Gets together: Not on file    Attends religious service: Not on file    Active member of club or organization: Not on file    Attends meetings of clubs or organizations: Not on file    Relationship status: Not on file  . Intimate partner violence:    Fear of current or ex partner: Not on file    Emotionally abused: Not on file    Physically abused: Not on file    Forced sexual activity: Not on file  Other Topics Concern  . Not  on file  Social History Narrative  . Not on file     PHYSICAL EXAM VIDEO EXAM  GENERAL EXAM/CONSTITUTIONAL:  Vitals: There were no vitals filed for this visit.  There is no height or weight on file to calculate BMI. Wt Readings from Last 3 Encounters:  02/23/19 127 lb (57.6 kg)  02/05/19 127 lb 12.8 oz (58 kg)  11/27/18 128 lb (58.1 kg)     Patient is in no distress; well developed, nourished and groomed; neck is supple  NEUROLOGIC: MENTAL STATUS:  MMSE - Mini Mental State Exam 02/07/2016  Orientation to time 5  Orientation to Place 5  Registration 3  Attention/ Calculation 5  Recall 3  Language- name 2 objects 2  Language- repeat 1  Language- follow 3 step command 3  Language- read & follow direction 1  Write a sentence 1  Copy design 1  Total score 30    awake, alert, oriented to person, place and time  recent and remote memory intact  normal attention and concentration  language fluent, comprehension intact, naming intact  fund of knowledge appropriate  CRANIAL NERVE:   2nd, 3rd, 4th, 6th - visual fields full to confrontation, extraocular muscles intact, no nystagmus  5th - facial sensation symmetric  7th - facial strength symmetric  8th - hearing intact  11th - shoulder shrug symmetric  12th - tongue protrusion midline  MOTOR:   NO DRIFT IN BUE  COORDINATION:   fine finger movements normal     DIAGNOSTIC DATA (LABS, IMAGING, TESTING) - I reviewed patient records, labs, notes, testing and imaging myself where available.  Lab Results  Component Value Date   WBC 4.7 02/07/2016   HGB 13.1 02/07/2016   HCT 39.1 02/07/2016   MCV 85 02/07/2016   PLT 227 02/07/2016      Component Value Date/Time   NA 143 02/05/2019 1327   K 4.3 02/05/2019 1327   CL 102 02/05/2019 1327   CO2 24 02/05/2019 1327   GLUCOSE 82 02/05/2019 1327   BUN 8 02/05/2019 1327   CREATININE 0.84 02/05/2019 1327   CALCIUM 10.0 02/05/2019 1327   PROT 6.9  02/07/2016 0957   ALBUMIN 4.7 02/07/2016 0957   AST 23 02/07/2016 0957   ALT 13 02/07/2016 0957   ALKPHOS 63 02/07/2016 0957   BILITOT 0.5 02/07/2016 0957   GFRNONAA 78 02/05/2019 1327   GFRAA 90 02/05/2019 1327   Lab Results  Component Value Date   CHOL 209 (H) 02/07/2016   HDL 51 02/07/2016   LDLCALC 131 (H) 02/07/2016   TRIG 135 02/07/2016   CHOLHDL 4.1 02/07/2016   No  results found for: HGBA1C Lab Results  Component Value Date   VITAMINB12 501 02/07/2016   Lab Results  Component Value Date   TSH 1.330 02/07/2016    02/10/19 CT HEAD [I reviewed images myself and agree with interpretation. -VRP]   - negative     ASSESSMENT AND PLAN  58 y.o. year old female here with transient dizziness, balance problem, word finding difficulties, cognitive difficulties, since February 2020.  We will proceed with further work-up to rule out demyelinating disease or cervical spinal cord abnormalities.   Dx:  1. Spell of abnormal behavior   2. Dizziness   3. Balance disorder     Virtual Visit via Video Note  I connected with Shelby Skinner on 03/24/19 at  1:30 PM EDT by a video enabled telemedicine application and verified that I am speaking with the correct person using two identifiers.   I discussed the limitations of evaluation and management by telemedicine and the availability of in person appointments. The patient expressed understanding and agreed to proceed.   I discussed the assessment and treatment plan with the patient. The patient was provided an opportunity to ask questions and all were answered. The patient agreed with the plan and demonstrated an understanding of the instructions.   The patient was advised to call back or seek an in-person evaluation if the symptoms worsen or if the condition fails to improve as anticipated.  I provided 30 minutes of non-face-to-face time during this encounter.   PLAN:  TRANSIENT COGNITIVE / BEHAVIOR CHANGES (rule out  demyelinating disease) - check MRI brain w /wo  OFF BALANCE / perineal numbness / incontinence / hand numbness - check MRI cervical w/wo   Orders Placed This Encounter  Procedures  . MR BRAIN W WO CONTRAST  . MR CERVICAL SPINE W WO CONTRAST   Return for pending if symptoms worsen or fail to improve. pending test results    Suanne Marker, MD 03/24/2019, 1:59 PM Certified in Neurology, Neurophysiology and Neuroimaging  [facility] [address] [place], [place] [address] [phone]

## 2019-03-25 ENCOUNTER — Telehealth: Payer: Self-pay | Admitting: Diagnostic Neuroimaging

## 2019-03-25 NOTE — Telephone Encounter (Signed)
[place] order sent to GI. They will reach out to the pt to schedule.

## 2019-03-26 ENCOUNTER — Telehealth: Payer: Self-pay | Admitting: Physical Therapy

## 2019-03-26 ENCOUNTER — Ambulatory Visit: Admitting: Diagnostic Neuroimaging

## 2019-03-26 NOTE — Telephone Encounter (Signed)
Patient was contacted today regarding the temporary reduction of OP Rehab Services due to concerns for community transmission of Covid-19.  Patient was instructed to continue HEP; pt reported agreement.

## 2019-04-02 ENCOUNTER — Telehealth: Payer: Self-pay | Admitting: Physical Therapy

## 2019-04-02 NOTE — Telephone Encounter (Signed)
Shelby Skinner was contacted today regarding the temporary reduction of OP Rehab Services  due to concerns for community transmission of Covid-19.  Left voicemail for patient to give our office a call back.

## 2019-04-07 ENCOUNTER — Encounter: Admitting: Family Medicine

## 2019-04-12 ENCOUNTER — Other Ambulatory Visit: Payer: Self-pay

## 2019-04-12 ENCOUNTER — Ambulatory Visit
Admission: RE | Admit: 2019-04-12 | Discharge: 2019-04-12 | Disposition: A | Discharge status: Home / Self Care | Source: Ambulatory Visit | Attending: Diagnostic Neuroimaging | Admitting: Diagnostic Neuroimaging

## 2019-04-12 DIAGNOSIS — R42 Dizziness and giddiness: Secondary | ICD-10-CM

## 2019-04-12 DIAGNOSIS — R4689 Other symptoms and signs involving appearance and behavior: Secondary | ICD-10-CM

## 2019-04-12 DIAGNOSIS — R2689 Other abnormalities of gait and mobility: Secondary | ICD-10-CM

## 2019-04-12 MED ORDER — GADOBENATE DIMEGLUMINE 529 MG/ML IV SOLN
10.0000 mL | Freq: Once | INTRAVENOUS | Status: AC | PRN
  Administered 2019-04-12: 10 mL via INTRAVENOUS

## 2019-04-13 ENCOUNTER — Encounter: Admitting: Family

## 2019-04-14 ENCOUNTER — Telehealth: Payer: Self-pay | Admitting: *Deleted

## 2019-04-14 NOTE — Telephone Encounter (Signed)
Spoke with patient and informed her that her MRI brain and MRI cervical spine results are unremarkable. Advised she monitor symptoms, return to PCP and call us for any concerns. She Patient verbalized understanding, appreciation.

## 2019-04-19 ENCOUNTER — Other Ambulatory Visit: Payer: Self-pay

## 2019-04-19 ENCOUNTER — Ambulatory Visit (INDEPENDENT_AMBULATORY_CARE_PROVIDER_SITE_OTHER): Admitting: *Deleted

## 2019-04-19 DIAGNOSIS — Z23 Encounter for immunization: Secondary | ICD-10-CM

## 2019-04-19 NOTE — Progress Notes (Signed)
Pt given Hep A vaccine Tolerated well 

## 2019-05-12 ENCOUNTER — Other Ambulatory Visit

## 2019-05-20 ENCOUNTER — Telehealth: Payer: Self-pay | Admitting: Physical Therapy

## 2019-05-20 NOTE — Telephone Encounter (Signed)
Shelby Skinner was contacted in regards to resuming therapy. She stated she was doing pretty good but would like to think about starting therapy back. She will call us back to let us know if she would like to come in or be discharged.

## 2019-08-02 ENCOUNTER — Ambulatory Visit (INDEPENDENT_AMBULATORY_CARE_PROVIDER_SITE_OTHER): Admitting: Physician Assistant

## 2019-08-02 DIAGNOSIS — Z8619 Personal history of other infectious and parasitic diseases: Secondary | ICD-10-CM

## 2019-08-02 DIAGNOSIS — Z8616 Personal history of COVID-19: Secondary | ICD-10-CM | POA: Insufficient documentation

## 2019-08-02 DIAGNOSIS — J40 Bronchitis, not specified as acute or chronic: Secondary | ICD-10-CM

## 2019-08-02 DIAGNOSIS — J019 Acute sinusitis, unspecified: Secondary | ICD-10-CM

## 2019-08-02 MED ORDER — AMOXICILLIN 500 MG PO CAPS: 1000.0000 mg | ORAL_CAPSULE | Freq: Two times a day (BID) | ORAL | 0 refills | Status: DC

## 2019-08-03 NOTE — Progress Notes (Signed)
     Telephone visit  Subjective: TI:[id number] PCP: Baruch Gouty, FNP QQP:[id number] Shelby Skinner is a 58 y.o. female calls for telephone consult today. Patient provides verbal consent for consult held via phone.  Patient is identified with 2 separate identifiers.  At this time the entire area is on COVID-19 social distancing and stay home orders are in place.  Patient is of higher risk and therefore we are performing this by a virtual method.  Location of patient: home Location of provider: [facility] Others present for call: no  Couple weeks ago the patient was diagnosed with COVID-19.  She felt like she was getting some better but has now continued with sinus and bronchial symptoms.  This patient has had many days of sinus headache and postnasal drainage. There is copious drainage at times. Denies any fever at this time. There has been a history of sinus infections in the past.  No history of sinus surgery. There is cough at night. It has become more prevalent in recent days.    ROS: Per HPI  Allergies  Allergen Reactions  . Codeine Nausea Only  . Gluten Meal Swelling, Palpitations and Other (See Comments)    GI issues  . Mushroom Extract Complex Nausea Only, Palpitations and Other (See Comments)    Jelly mushrooms. Tremors, dry mouth and dizziness.    No past medical history on file.  Current Outpatient Medications:  .  amoxicillin (AMOXIL) 500 MG capsule, Take 2 capsules (1,000 mg total) by mouth 2 (two) times daily., Disp: 40 capsule, Rfl: 0 .  cholecalciferol (VITAMIN D) 1000 units tablet, Take 1,000 Units by mouth daily., Disp: , Rfl:  .  Cyanocobalamin (CVS B12 GUMMIES PO), Take 1 tablet by mouth daily., Disp: , Rfl:  .  meclizine (ANTIVERT) 12.5 MG tablet, Take 1 tablet (12.5 mg total) by mouth 3 (three) times daily as needed for dizziness. (Patient not taking: Reported on 02/23/2019), Disp: 30 tablet, Rfl: 0 .  Omega-3 Fatty Acids (CVS FISH OIL PO), Take 1 tablet by mouth daily.,  Disp: , Rfl:  .  vitamin C (ASCORBIC ACID) 500 MG tablet, Take 1,000 mg by mouth daily. , Disp: , Rfl:   Assessment/ Plan: 58 y.o. female   1. Acute non-recurrent sinusitis, unspecified location - amoxicillin (AMOXIL) 500 MG capsule; Take 2 capsules (1,000 mg total) by mouth 2 (two) times daily.  Dispense: 40 capsule; Refill: 0  2. Bronchitis - amoxicillin (AMOXIL) 500 MG capsule; Take 2 capsules (1,000 mg total) by mouth 2 (two) times daily.  Dispense: 40 capsule; Refill: 0  3. History of 2019 novel coronavirus disease (COVID-19)   No follow-ups on file.  Continue all other maintenance medications as listed above.  Start time: 4:52 PM End time 5:05 PM Meds ordered this encounter  Medications  . amoxicillin (AMOXIL) 500 MG capsule    Sig: Take 2 capsules (1,000 mg total) by mouth 2 (two) times daily.    Dispense:  40 capsule    Refill:  0    Order Specific Question:   Supervising Provider    Answer:   Janora Norlander [[id number]]    Particia Nearing PA-C [place] [phone]

## 2019-08-13 ENCOUNTER — Telehealth: Payer: Self-pay | Admitting: Family Medicine

## 2019-08-13 NOTE — Telephone Encounter (Signed)
Patient states that she is some better.  Finished amoxicillin.  No relief with flonase and otc allergy medication.  Patient is fatigue, dyspnea, ear pain (stopped).

## 2019-08-13 NOTE — Telephone Encounter (Signed)
She should be re-evaluated 

## 2019-08-16 ENCOUNTER — Encounter: Payer: Self-pay | Admitting: Physician Assistant

## 2019-08-16 ENCOUNTER — Ambulatory Visit (INDEPENDENT_AMBULATORY_CARE_PROVIDER_SITE_OTHER): Admitting: Physician Assistant

## 2019-08-16 DIAGNOSIS — H68103 Unspecified obstruction of Eustachian tube, bilateral: Secondary | ICD-10-CM

## 2019-08-16 DIAGNOSIS — Z8616 Personal history of COVID-19: Secondary | ICD-10-CM

## 2019-08-16 DIAGNOSIS — Z8619 Personal history of other infectious and parasitic diseases: Secondary | ICD-10-CM

## 2019-08-16 MED ORDER — PREDNISONE 10 MG (48) PO TBPK: ORAL_TABLET | ORAL | 0 refills | Status: DC

## 2019-08-16 NOTE — Progress Notes (Signed)
     Telephone visit  Subjective: CC: Postnasal drip, history of COVID-19 infection PCP: Baruch Gouty, FNP DJS:[id number] Shelby Skinner is a 58 y.o. female calls for telephone consult today. Patient provides verbal consent for consult held via phone.  Patient is identified with 2 separate identifiers.  At this time the entire area is on COVID-19 social distancing and stay home orders are in place.  Patient is of higher risk and therefore we are performing this by a virtual method.  Location of patient: Passenger in car Location of provider: [facility] Others present for call: Husband driving  The patient continues with some upper respiratory symptoms.  She has been taking some Claritin.  However she is continued with thin watery postnasal drainage on a daily basis.  She also is having some fullness in her ears.  In the past she had try to use Flonase for eustachian tube dysfunction however she had a nosebleed from the medication.  We will not use that medication.  She is up for taking a prednisone pack to see if this will help with the swelling through the eustachian tube and the posterior nasal passages.   ROS: Per HPI  Allergies  Allergen Reactions  . Codeine Nausea Only  . Flonase [Fluticasone Propionate]     Nose bleed  . Gluten Meal Swelling, Palpitations and Other (See Comments)    GI issues  . Mushroom Extract Complex Nausea Only, Palpitations and Other (See Comments)    Jelly mushrooms. Tremors, dry mouth and dizziness.    No past medical history on file.  Current Outpatient Medications:  .  amoxicillin (AMOXIL) 500 MG capsule, Take 2 capsules (1,000 mg total) by mouth 2 (two) times daily., Disp: 40 capsule, Rfl: 0 .  cholecalciferol (VITAMIN D) 1000 units tablet, Take 1,000 Units by mouth daily., Disp: , Rfl:  .  Cyanocobalamin (CVS B12 GUMMIES PO), Take 1 tablet by mouth daily., Disp: , Rfl:  .  meclizine (ANTIVERT) 12.5 MG tablet, Take 1 tablet (12.5 mg total) by mouth 3 (three)  times daily as needed for dizziness., Disp: 30 tablet, Rfl: 0 .  Omega-3 Fatty Acids (CVS FISH OIL PO), Take 1 tablet by mouth daily., Disp: , Rfl:  .  predniSONE (STERAPRED UNI-PAK 48 TAB) 10 MG (48) TBPK tablet, Take as directed for 12 days, Disp: 48 tablet, Rfl: 0 .  vitamin C (ASCORBIC ACID) 500 MG tablet, Take 1,000 mg by mouth daily. , Disp: , Rfl:   Assessment/ Plan: 58 y.o. female   1. Obstruction of both eustachian tubes - predniSONE (STERAPRED UNI-PAK 48 TAB) 10 MG (48) TBPK tablet; Take as directed for 12 days  Dispense: 48 tablet; Refill: 0  2. History of 2019 novel coronavirus disease (COVID-19) - predniSONE (STERAPRED UNI-PAK 48 TAB) 10 MG (48) TBPK tablet; Take as directed for 12 days  Dispense: 48 tablet; Refill: 0   No follow-ups on file.  Continue all other maintenance medications as listed above.  Start time: 1:40 PM End time: 1:51 PM  Meds ordered this encounter  Medications  . predniSONE (STERAPRED UNI-PAK 48 TAB) 10 MG (48) TBPK tablet    Sig: Take as directed for 12 days    Dispense:  48 tablet    Refill:  0    Order Specific Question:   Supervising Provider    Answer:   Janora Norlander [[id number]]    Particia Nearing PA-C [place] [phone]

## 2019-08-17 NOTE — Telephone Encounter (Signed)
Patient seen since telephone encounter.  This encounter will now be closed. 

## 2019-08-20 ENCOUNTER — Encounter: Payer: Self-pay | Admitting: Physician Assistant

## 2019-08-20 ENCOUNTER — Ambulatory Visit (INDEPENDENT_AMBULATORY_CARE_PROVIDER_SITE_OTHER): Admitting: Physician Assistant

## 2019-08-20 DIAGNOSIS — J301 Allergic rhinitis due to pollen: Secondary | ICD-10-CM | POA: Diagnosis not present

## 2019-08-20 DIAGNOSIS — R232 Flushing: Secondary | ICD-10-CM

## 2019-08-22 NOTE — Progress Notes (Signed)
     Telephone visit  Subjective: CC: Facial flushing PCP: Baruch Gouty, FNP CWC:[id number] Esguerra is a 58 y.o. female calls for telephone consult today. Patient provides verbal consent for consult held via phone.  Patient is identified with 2 separate identifiers.  At this time the entire area is on COVID-19 social distancing and stay home orders are in place.  Patient is of higher risk and therefore we are performing this by a virtual method.  Location of patient: Home Location of provider: [facility] Others present for call: None  Patient with known recent history of COVID-19 infection and continued respiratory infection.  She was started with some allergy medication and a prednisone pack just a few days ago.  More than likely the facial flushing she is experiencing is coming from not.  It is just started this morning.  She states that overall her eustachian tube do feel much more open the swelling in her head is much better.  So encouraged her to reduce the prednisone and take the last 3 days and then go ahead and stop it.  She can continue her other medications.  She will give [place] a call if there are any other issues.   ROS: Per HPI  Allergies  Allergen Reactions  . Codeine Nausea Only  . Flonase [Fluticasone Propionate]     Nose bleed  . Gluten Meal Swelling, Palpitations and Other (See Comments)    GI issues  . Mushroom Extract Complex Nausea Only, Palpitations and Other (See Comments)    Jelly mushrooms. Tremors, dry mouth and dizziness.    History reviewed. No pertinent past medical history.  Current Outpatient Medications:  .  cholecalciferol (VITAMIN D) 1000 units tablet, Take 1,000 Units by mouth daily., Disp: , Rfl:  .  Cyanocobalamin (CVS B12 GUMMIES PO), Take 1 tablet by mouth daily., Disp: , Rfl:  .  meclizine (ANTIVERT) 12.5 MG tablet, Take 1 tablet (12.5 mg total) by mouth 3 (three) times daily as needed for dizziness., Disp: 30 tablet, Rfl: 0 .  Omega-3 Fatty Acids  (CVS FISH OIL PO), Take 1 tablet by mouth daily., Disp: , Rfl:  .  predniSONE (STERAPRED UNI-PAK 48 TAB) 10 MG (48) TBPK tablet, Take as directed for 12 days, Disp: 48 tablet, Rfl: 0 .  vitamin C (ASCORBIC ACID) 500 MG tablet, Take 1,000 mg by mouth daily. , Disp: , Rfl:   Assessment/ Plan: 58 y.o. female   1. Facial flushing Reduce prednisone  2. Allergic rhinitis due to pollen, unspecified seasonality Continue medications   No follow-ups on file.  Continue all other maintenance medications as listed above.  Start time: 1:46 PM End time: 1:54 PM  No orders of the defined types were placed in this encounter.   Particia Nearing PA-C [place] [phone]

## 2019-12-07 ENCOUNTER — Telehealth: Payer: Self-pay | Admitting: Family Medicine

## 2019-12-07 ENCOUNTER — Other Ambulatory Visit: Payer: Self-pay

## 2019-12-07 NOTE — Telephone Encounter (Signed)
Returned patients call and she is aware she can not be seen in office due to her symptoms.  Will wait for Selinda Eon to reply to previous message.

## 2019-12-07 NOTE — Telephone Encounter (Signed)
If she continues to feel bad, she needs to be reevaluated. Symptoms from COVID-19 can linger for months. She needs to stay hydrated and make sure she is taking Zinc, Vit C, B complex to help with recovery.

## 2019-12-07 NOTE — Telephone Encounter (Signed)
Do you think she needs to see a specialist for it ? Can labs be ordered for her to co someone else get them drawn

## 2019-12-07 NOTE — Telephone Encounter (Signed)
Patient was tested positive 07//25/20. Patient had an appointment for tomorrow , but has lingering fatigue so she can not come in. Insurance does not cover phone visit. Pt c/o of fatigue she does not know if she needs to see specialist. She is very frustrated because she can not be seen in the office. She does not want to go to an urgent care just really wanting some info for [place].

## 2019-12-08 ENCOUNTER — Ambulatory Visit: Admitting: Family Medicine

## 2019-12-08 NOTE — Telephone Encounter (Signed)
Due to ongoing symptoms, I can order a CBC and CMP along with a CXR. She will need to go to [facility] to have this completed.

## 2019-12-08 NOTE — Telephone Encounter (Signed)
Patient aware.

## 2019-12-09 ENCOUNTER — Telehealth: Payer: Self-pay | Admitting: Family Medicine

## 2019-12-09 NOTE — Telephone Encounter (Signed)
Patient is just going to an urgent care! Just because she feels so bad just wanted to tell Sharyn Lull thank you for everything. FYI

## 2019-12-09 NOTE — Telephone Encounter (Signed)
Wants to speak with nurse regarding orders that the doctor sent in.

## 2020-02-01 ENCOUNTER — Other Ambulatory Visit: Payer: Self-pay

## 2020-02-02 ENCOUNTER — Encounter: Payer: Self-pay | Admitting: Family Medicine

## 2020-02-02 ENCOUNTER — Ambulatory Visit (INDEPENDENT_AMBULATORY_CARE_PROVIDER_SITE_OTHER): Admitting: Family Medicine

## 2020-02-02 VITALS — BP 118/77 | HR 75 | Temp 98.6°F | Resp 16 | Ht 62.0 in | Wt 130.8 lb

## 2020-02-02 DIAGNOSIS — Z23 Encounter for immunization: Secondary | ICD-10-CM

## 2020-02-02 DIAGNOSIS — Z7189 Other specified counseling: Secondary | ICD-10-CM | POA: Diagnosis not present

## 2020-02-02 DIAGNOSIS — Z7184 Encounter for health counseling related to travel: Secondary | ICD-10-CM | POA: Diagnosis not present

## 2020-02-02 DIAGNOSIS — Z789 Other specified health status: Secondary | ICD-10-CM

## 2020-02-02 MED ORDER — ATOVAQUONE-PROGUANIL HCL 250-100 MG PO TABS: 1.0000 | ORAL_TABLET | Freq: Every day | ORAL | 0 refills | Status: DC

## 2020-02-02 MED ORDER — AZITHROMYCIN 500 MG PO TABS: 500.0000 mg | ORAL_TABLET | Freq: Every day | ORAL | 0 refills | Status: AC

## 2020-02-02 MED ORDER — ONDANSETRON HCL 4 MG PO TABS: 4.0000 mg | ORAL_TABLET | Freq: Three times a day (TID) | ORAL | 0 refills | Status: DC | PRN

## 2020-02-02 NOTE — Progress Notes (Signed)
Subjective:  Patient ID: Shelby Skinner, female    DOB: 26-Dec-1960, 59 y.o.   MRN: [id number]  Patient Care Team: Baruch Gouty, FNP as PCP - General (Family Medicine)   Chief Complaint:  medication refill   HPI: Shelby Skinner is a 59 y.o. female presenting on 02/02/2020 for medication refill  Patient presents today for evaluation prior to travel to [place].  Patients Tdap immunization is due in June of this year.  Patient states that she will be in [place] for at least a month and possibly longer.  Will update Tdap today.  Will provide Zithromax and Malarone for trip.  Patient will take Malarone for at least 2 days prior to departure for [place] and for 7 to days after return.  Patient will be provided with Zithromax 500 mg for 3 days for traveler's diarrhea.  Patient aware to initiate therapy if diarrhea starts.  Will provide enough medication for patient's trip.  Discussed other medications to take with her on her trip.  Infection prevention discussed in detail.  Relevant past medical, surgical, family, and social history reviewed and updated as indicated.  Allergies and medications reviewed and updated. Date reviewed: Chart in Epic.   History reviewed. No pertinent past medical history.  Past Surgical History:  Procedure Laterality Date  . APPENDECTOMY  2010    Social History   Socioeconomic History  . Marital status: Married    Spouse name: Not on file  . Number of children: Not on file  . Years of education: Not on file  . Highest education level: Not on file  Occupational History  . Not on file  Tobacco Use  . Smoking status: Never Smoker  . Smokeless tobacco: Never Used  Substance and Sexual Activity  . Alcohol use: Yes    Alcohol/week: 1.0 standard drinks    Types: 1 Glasses of wine per week  . Drug use: Not on file  . Sexual activity: Not on file  Other Topics Concern  . Not on file  Social History Narrative  . Not on file   Social  Determinants of Health   Financial Resource Strain:   . Difficulty of Paying Living Expenses: Not on file  Food Insecurity:   . Worried About Charity fundraiser in the Last Year: Not on file  . Ran Out of Food in the Last Year: Not on file  Transportation Needs:   . Lack of Transportation (Medical): Not on file  . Lack of Transportation (Non-Medical): Not on file  Physical Activity:   . Days of Exercise per Week: Not on file  . Minutes of Exercise per Session: Not on file  Stress:   . Feeling of Stress : Not on file  Social Connections:   . Frequency of Communication with Friends and Family: Not on file  . Frequency of Social Gatherings with Friends and Family: Not on file  . Attends Religious Services: Not on file  . Active Member of Clubs or Organizations: Not on file  . Attends Archivist Meetings: Not on file  . Marital Status: Not on file  Intimate Partner Violence:   . Fear of Current or Ex-Partner: Not on file  . Emotionally Abused: Not on file  . Physically Abused: Not on file  . Sexually Abused: Not on file    Outpatient Encounter Medications as of 02/02/2020  Medication Sig  . cholecalciferol (VITAMIN D) 1000 units tablet Take 1,000 Units by mouth daily.  . Melatonin  10 MG TABS Take by mouth at bedtime.  . Omega-3 Fatty Acids (CVS FISH OIL PO) Take 1 tablet by mouth daily.  . vitamin C (ASCORBIC ACID) 500 MG tablet Take 1,000 mg by mouth daily.   Marland Kitchen atovaquone-proguanil (MALARONE) 250-100 MG TABS tablet Take 1 tablet by mouth daily.  Marland Kitchen azithromycin (ZITHROMAX) 500 MG tablet Take 1 tablet (500 mg total) by mouth daily for 12 days.  . ondansetron (ZOFRAN) 4 MG tablet Take 1 tablet (4 mg total) by mouth every 8 (eight) hours as needed for nausea or vomiting.  . [DISCONTINUED] Cyanocobalamin (CVS B12 GUMMIES PO) Take 1 tablet by mouth daily.  . [DISCONTINUED] meclizine (ANTIVERT) 12.5 MG tablet Take 1 tablet (12.5 mg total) by mouth 3 (three) times daily as  needed for dizziness.  . [DISCONTINUED] predniSONE (STERAPRED UNI-PAK 48 TAB) 10 MG (48) TBPK tablet Take as directed for 12 days   No facility-administered encounter medications on file as of 02/02/2020.    Allergies  Allergen Reactions  . Codeine Nausea Only  . Flonase [Fluticasone Propionate]     Nose bleed  . Gluten Meal Swelling, Palpitations and Other (See Comments)    GI issues  . Mushroom Extract Complex Nausea Only, Palpitations and Other (See Comments)    Jelly mushrooms. Tremors, dry mouth and dizziness.     Review of Systems  Constitutional: Negative for activity change, appetite change, chills, fatigue and fever.  HENT: Negative.   Eyes: Negative.   Respiratory: Negative for cough, chest tightness and shortness of breath.   Cardiovascular: Negative for chest pain, palpitations and leg swelling.  Gastrointestinal: Negative for blood in stool, constipation, diarrhea, nausea and vomiting.  Endocrine: Negative.   Genitourinary: Negative for dysuria, frequency and urgency.  Musculoskeletal: Negative for arthralgias and myalgias.  Skin: Negative.   Allergic/Immunologic: Negative.   Neurological: Negative for dizziness and headaches.  Hematological: Negative.   Psychiatric/Behavioral: Negative for confusion, hallucinations, sleep disturbance and suicidal ideas.  All other systems reviewed and are negative.       Objective:  BP 118/77   Pulse 75   Temp 98.6 F (37 C)   Resp 16   Ht 5' 2" (1.575 m)   Wt 130 lb 12.8 oz (59.3 kg)   LMP 05/23/2013 (Approximate)   SpO2 98%   BMI 23.92 kg/m    Wt Readings from Last 3 Encounters:  02/02/20 130 lb 12.8 oz (59.3 kg)  02/23/19 127 lb (57.6 kg)  02/05/19 127 lb 12.8 oz (58 kg)    Physical Exam Vitals and nursing note reviewed.  Constitutional:      General: She is not in acute distress.    Appearance: Normal appearance. She is well-developed and well-groomed. She is not ill-appearing, toxic-appearing or  diaphoretic.  HENT:     Head: Normocephalic and atraumatic.     Jaw: There is normal jaw occlusion.     Right Ear: Hearing normal.     Left Ear: Hearing normal.     Nose: Nose normal.     Mouth/Throat:     Lips: Pink.     Mouth: Mucous membranes are moist.     Pharynx: Oropharynx is clear. Uvula midline.  Eyes:     General: Lids are normal.     Extraocular Movements: Extraocular movements intact.     Conjunctiva/sclera: Conjunctivae normal.     Pupils: Pupils are equal, round, and reactive to light.  Neck:     Thyroid: No thyroid mass, thyromegaly or thyroid tenderness.  Vascular: No carotid bruit or JVD.     Trachea: Trachea and phonation normal.  Cardiovascular:     Rate and Rhythm: Normal rate and regular rhythm.     Chest Wall: PMI is not displaced.     Pulses: Normal pulses.     Heart sounds: Normal heart sounds. No murmur. No friction rub. No gallop.   Pulmonary:     Effort: Pulmonary effort is normal. No respiratory distress.     Breath sounds: Normal breath sounds. No wheezing.  Abdominal:     General: Bowel sounds are normal. There is no distension or abdominal bruit.     Palpations: Abdomen is soft. There is no hepatomegaly or splenomegaly.     Tenderness: There is no abdominal tenderness. There is no right CVA tenderness or left CVA tenderness.     Hernia: No hernia is present.  Musculoskeletal:        General: Normal range of motion.     Cervical back: Normal range of motion and neck supple.     Right lower leg: No edema.     Left lower leg: No edema.  Lymphadenopathy:     Cervical: No cervical adenopathy.  Skin:    General: Skin is warm and dry.     Capillary Refill: Capillary refill takes less than 2 seconds.     Coloration: Skin is not cyanotic, jaundiced or pale.     Findings: No rash.  Neurological:     General: No focal deficit present.     Mental Status: She is alert and oriented to person, place, and time.     Cranial Nerves: Cranial nerves are  intact.     Sensory: Sensation is intact.     Motor: Motor function is intact.     Coordination: Coordination is intact.     Gait: Gait is intact.     Deep Tendon Reflexes: Reflexes are normal and symmetric.  Psychiatric:        Attention and Perception: Attention and perception normal.        Mood and Affect: Mood and affect normal.        Speech: Speech normal.        Behavior: Behavior normal. Behavior is cooperative.        Thought Content: Thought content normal.        Cognition and Memory: Cognition and memory normal.        Judgment: Judgment normal.     Results for orders placed or performed in visit on 02/05/19  BMP8+EGFR  Result Value Ref Range   Glucose 82 65 - 99 mg/dL   BUN 8 6 - 24 mg/dL   Creatinine, Ser 0.84 0.57 - 1.00 mg/dL   GFR calc non Af Amer 78 >59 mL/min/1.73   GFR calc Af Amer 90 >59 mL/min/1.73   BUN/Creatinine Ratio 10 9 - 23   Sodium 143 134 - 144 mmol/L   Potassium 4.3 3.5 - 5.2 mmol/L   Chloride 102 96 - 106 mmol/L   CO2 24 20 - 29 mmol/L   Calcium 10.0 8.7 - 10.2 mg/dL       Pertinent labs & imaging results that were available during my care of the patient were reviewed by me and considered in my medical decision making.  Assessment & Plan:  Shelby Skinner was seen today for medication refill.  Diagnoses and all orders for this visit:  Encounter for health counseling related to travel Engages in travel abroad Patients Tdap immunization is due  in June of this year.  Patient states that she will be in [place] for at least a month and possibly longer.  Will update Tdap today.  Will provide Zithromax and Malarone for trip.  Patient will take Malarone for at least 2 days prior to departure for [place] and for 7 to days after return.  Patient will be provided with Zithromax 500 mg for 3 days for traveler's diarrhea.  Patient aware to initiate therapy if diarrhea starts.  Will provide enough medication for patient's trip. Discussed other medications  to take with her on her trip.  Infection prevention discussed in detail. -     azithromycin (ZITHROMAX) 500 MG tablet; Take 1 tablet (500 mg total) by mouth daily for 12 days. -     atovaquone-proguanil (MALARONE) 250-100 MG TABS tablet; Take 1 tablet by mouth daily. -     ondansetron (ZOFRAN) 4 MG tablet; Take 1 tablet (4 mg total) by mouth every 8 (eight) hours as needed for nausea or vomiting. -     Tdap vaccine greater than or equal to 7yo IM  Need for vaccination -     Tdap vaccine greater than or equal to 7yo IM     Continue all other maintenance medications.  Follow up plan: Return if symptoms worsen or fail to improve.  Continue healthy lifestyle choices, including diet (rich in fruits, vegetables, and lean proteins, and low in salt and simple carbohydrates) and exercise (at least 30 minutes of moderate physical activity daily).   The above assessment and management plan was discussed with the patient. The patient verbalized understanding of and has agreed to the management plan. Patient is aware to call the clinic if they develop any new symptoms or if symptoms persist or worsen. Patient is aware when to return to the clinic for a follow-up visit. Patient educated on when it is appropriate to go to the emergency department.   Monia Pouch, FNP-C [facility] [phone]

## 2020-06-19 ENCOUNTER — Ambulatory Visit (INDEPENDENT_AMBULATORY_CARE_PROVIDER_SITE_OTHER)

## 2020-06-19 ENCOUNTER — Encounter: Payer: Self-pay | Admitting: Family

## 2020-06-19 ENCOUNTER — Other Ambulatory Visit: Payer: Self-pay | Admitting: Family Medicine

## 2020-06-19 ENCOUNTER — Ambulatory Visit (INDEPENDENT_AMBULATORY_CARE_PROVIDER_SITE_OTHER): Admitting: Family

## 2020-06-19 ENCOUNTER — Other Ambulatory Visit: Payer: Self-pay

## 2020-06-19 ENCOUNTER — Other Ambulatory Visit (HOSPITAL_COMMUNITY)
Admission: RE | Admit: 2020-06-19 | Discharge: 2020-06-19 | Disposition: A | Discharge status: Home / Self Care | Source: Ambulatory Visit | Attending: Family | Admitting: Family

## 2020-06-19 ENCOUNTER — Encounter: Payer: Self-pay | Admitting: Family Medicine

## 2020-06-19 VITALS — BP 135/87 | HR 82 | Temp 98.4°F | Wt 130.0 lb

## 2020-06-19 DIAGNOSIS — Z0001 Encounter for general adult medical examination with abnormal findings: Secondary | ICD-10-CM

## 2020-06-19 DIAGNOSIS — Z8616 Personal history of COVID-19: Secondary | ICD-10-CM

## 2020-06-19 DIAGNOSIS — Z0000 Encounter for general adult medical examination without abnormal findings: Secondary | ICD-10-CM

## 2020-06-19 DIAGNOSIS — Z01419 Encounter for gynecological examination (general) (routine) without abnormal findings: Secondary | ICD-10-CM | POA: Diagnosis present

## 2020-06-19 DIAGNOSIS — R0602 Shortness of breath: Secondary | ICD-10-CM | POA: Diagnosis not present

## 2020-06-19 MED ORDER — ALBUTEROL SULFATE HFA 108 (90 BASE) MCG/ACT IN AERS: 2.0000 | INHALATION_SPRAY | Freq: Four times a day (QID) | RESPIRATORY_TRACT | 2 refills | Status: DC | PRN

## 2020-06-19 NOTE — Patient Instructions (Signed)
Shortness of Breath, Adult Shortness of breath is when a person has trouble breathing enough air or when a person feels like she or he is having trouble breathing in enough air. Shortness of breath could be a sign of a medical problem. Follow these instructions at home:   Pay attention to any changes in your symptoms.  Do not use any products that contain nicotine or tobacco, such as cigarettes, e-cigarettes, and chewing tobacco.  Do not smoke. Smoking is a common cause of shortness of breath. If you need help quitting, ask your health care provider.  Avoid things that can irritate your airways, such as: ? Mold. ? Dust. ? Air pollution. ? Chemical fumes. ? Things that can cause allergy symptoms (allergens), if you have allergies.  Keep your living space clean and free of mold and dust.  Rest as needed. Slowly return to your usual activities.  Take over-the-counter and prescription medicines only as told by your health care provider. This includes oxygen therapy and inhaled medicines.  Keep all follow-up visits as told by your health care provider. This is important. Contact a health care provider if:  Your condition does not improve as soon as expected.  You have a hard time doing your normal activities, even after you rest.  You have new symptoms. Get help right away if:  Your shortness of breath gets worse.  You have shortness of breath when you are resting.  You feel light-headed or you faint.  You have a cough that is not controlled with medicines.  You cough up blood.  You have pain with breathing.  You have pain in your chest, arms, shoulders, or abdomen.  You have a fever.  You cannot walk up stairs or exercise the way that you normally do. These symptoms may represent a serious problem that is an emergency. Do not wait to see if the symptoms will go away. Get medical help right away. Call your local emergency services (911 in the [place] Do not drive yourself  to the hospital. Summary  Shortness of breath is when a person has trouble breathing enough air. It can be a sign of a medical problem.  Avoid things that irritate your lungs, such as smoking, pollution, mold, and dust.  Pay attention to changes in your symptoms and contact your health care provider if you have a hard time completing daily activities because of shortness of breath. This information is not intended to replace advice given to you by your health care provider. Make sure you discuss any questions you have with your health care provider. Document Revised: 05/11/2018 Document Reviewed: 05/11/2018 Elsevier Patient Education  2020 [employer]  

## 2020-06-19 NOTE — Progress Notes (Signed)
Subjective:    Patient ID: Shelby Skinner, female    DOB: [birth year], 59 y.o.   MRN: [id number]  Chief Complaint  Patient presents with  . Annual Exam    with pap, lasting COVID symptoms   Pt presents to the office today to establish care and CPE with pap. She had COVID 07/17/2019. She reports she has intermittent SOB with activity since then and intermittent fatigue.  Gynecologic Exam The patient's pertinent negatives include no genital itching, vaginal bleeding or vaginal discharge. The patient is experiencing no pain.      Review of Systems  Genitourinary: Negative for vaginal discharge.  All other systems reviewed and are negative.  Family History  Problem Relation Age of Onset  . Multiple sclerosis Mother   . Crohn's disease Mother   . Hypertension Father   . Cancer Sister    Social History   Socioeconomic History  . Marital status: Married    Spouse name: Not on file  . Number of children: Not on file  . Years of education: Not on file  . Highest education level: Not on file  Occupational History  . Not on file  Tobacco Use  . Smoking status: Never Smoker  . Smokeless tobacco: Never Used  Vaping Use  . Vaping Use: Never used  Substance and Sexual Activity  . Alcohol use: Yes    Alcohol/week: 1.0 standard drink    Types: 1 Glasses of wine per week  . Drug use: Not on file  . Sexual activity: Not on file  Other Topics Concern  . Not on file  Social History Narrative  . Not on file   Social Determinants of Health   Financial Resource Strain:   . Difficulty of Paying Living Expenses:   Food Insecurity:   . Worried About Charity fundraiser in the Last Year:   . Arboriculturist in the Last Year:   Transportation Needs:   . Film/video editor (Medical):   Marland Kitchen Lack of Transportation (Non-Medical):   Physical Activity:   . Days of Exercise per Week:   . Minutes of Exercise per Session:   Stress:   . Feeling of Stress :   Social Connections:   .  Frequency of Communication with Friends and Family:   . Frequency of Social Gatherings with Friends and Family:   . Attends Religious Services:   . Active Member of Clubs or Organizations:   . Attends Archivist Meetings:   Marland Kitchen Marital Status:        Objective:   Physical Exam Vitals reviewed.  Constitutional:      General: She is not in acute distress.    Appearance: She is well-developed.  HENT:     Head: Normocephalic and atraumatic.     Right Ear: Tympanic membrane normal.     Left Ear: Tympanic membrane normal.  Eyes:     Pupils: Pupils are equal, round, and reactive to light.  Neck:     Thyroid: No thyromegaly.  Cardiovascular:     Rate and Rhythm: Normal rate and regular rhythm.     Heart sounds: Normal heart sounds. No murmur heard.   Pulmonary:     Effort: Pulmonary effort is normal. No respiratory distress.     Breath sounds: Normal breath sounds. No wheezing.  Chest:     Breasts:        Right: No swelling, bleeding, inverted nipple, mass, nipple discharge, skin change or tenderness.  Left: No swelling, bleeding, inverted nipple, mass, nipple discharge, skin change or tenderness.  Abdominal:     General: Bowel sounds are normal. There is no distension.     Palpations: Abdomen is soft.     Tenderness: There is no abdominal tenderness.  Genitourinary:    General: Normal vulva.     Comments: Bimanual exam- no adnexal masses or tenderness, ovaries nonpalpable   Cervix parous and pink- No discharge  Musculoskeletal:        General: No tenderness. Normal range of motion.     Cervical back: Normal range of motion and neck supple.  Skin:    General: Skin is warm and dry.  Neurological:     Mental Status: She is alert and oriented to person, place, and time.     Cranial Nerves: No cranial nerve deficit.     Deep Tendon Reflexes: Reflexes are normal and symmetric.  Psychiatric:        Behavior: Behavior normal.        Thought Content: Thought  content normal.        Judgment: Judgment normal.     BP 135/87   Pulse 82   Temp 98.4 F (36.9 C) (Temporal)   Wt 130 lb (59 kg)   LMP 05/23/2013 (Approximate)   SpO2 100%   BMI 23.78 kg/m      Assessment & Plan:  Shelby Skinner comes in today with chief complaint of Annual Exam (with pap, lasting COVID symptoms)   Diagnosis and orders addressed:  1. Annual physical exam - CMP14+EGFR - CBC with Differential/Platelet - Lipid panel - TSH - Cytology - PAP([place])  2. Gynecologic exam normal - CMP14+EGFR - Cytology - PAP([place])  3. SOB (shortness of breath) on exertion - EKG 12-Lead - albuterol (VENTOLIN HFA) 108 (90 Base) MCG/ACT inhaler; Inhale 2 puffs into the lungs every 6 (six) hours as needed for wheezing or shortness of breath.  Dispense: 18 g; Refill: 2 - DG Chest 2 View; Future - Ambulatory referral to Pulmonology  4. History of 2019 novel coronavirus disease (COVID-19) Given continued SOB at times will do referral to Pulmonologist  - albuterol (VENTOLIN HFA) 108 (90 Base) MCG/ACT inhaler; Inhale 2 puffs into the lungs every 6 (six) hours as needed for wheezing or shortness of breath.  Dispense: 18 g; Refill: 2 - DG Chest 2 View; Future - Ambulatory referral to Pulmonology   Labs pending Health Maintenance reviewed Diet and exercise encouraged  Follow up plan: 6 months    Evelina Dun, FNP

## 2020-06-20 LAB — CMP14+EGFR
ALT: 12 IU/L (ref 0–32)
AST: 23 IU/L (ref 0–40)
Albumin/Globulin Ratio: 2.2 (ref 1.2–2.2)
Albumin: 4.8 g/dL (ref 3.8–4.9)
Alkaline Phosphatase: 71 IU/L (ref 48–121)
BUN/Creatinine Ratio: 14 (ref 9–23)
BUN: 12 mg/dL (ref 6–24)
Bilirubin Total: 0.4 mg/dL (ref 0.0–1.2)
CO2: 22 mmol/L (ref 20–29)
Calcium: 9.7 mg/dL (ref 8.7–10.2)
Chloride: 102 mmol/L (ref 96–106)
Creatinine, Ser: 0.84 mg/dL (ref 0.57–1.00)
GFR calc Af Amer: 89 mL/min/{1.73_m2} (ref >59)
GFR calc non Af Amer: 77 mL/min/{1.73_m2} (ref >59)
Globulin, Total: 2.2 g/dL (ref 1.5–4.5)
Glucose: 103 mg/dL — ABNORMAL HIGH (ref 65–99)
Potassium: 4.2 mmol/L (ref 3.5–5.2)
Sodium: 140 mmol/L (ref 134–144)
Total Protein: 7 g/dL (ref 6.0–8.5)

## 2020-06-20 LAB — CBC WITH DIFFERENTIAL/PLATELET
Basophils Absolute: 0.1 10*3/uL (ref 0.0–0.2)
Basos: 1 %
EOS (ABSOLUTE): 0.1 10*3/uL (ref 0.0–0.4)
Eos: 2 %
Hematocrit: 40 % (ref 34.0–46.6)
Hemoglobin: 13.4 g/dL (ref 11.1–15.9)
Immature Grans (Abs): 0 10*3/uL (ref 0.0–0.1)
Immature Granulocytes: 0 %
Lymphocytes Absolute: 1.3 10*3/uL (ref 0.7–3.1)
Lymphs: 29 %
MCH: 29 pg (ref 26.6–33.0)
MCHC: 33.5 g/dL (ref 31.5–35.7)
MCV: 87 fL (ref 79–97)
Monocytes Absolute: 0.3 10*3/uL (ref 0.1–0.9)
Monocytes: 7 %
Neutrophils Absolute: 2.8 10*3/uL (ref 1.4–7.0)
Neutrophils: 61 %
Platelets: 218 10*3/uL (ref 150–450)
RBC: 4.62 x10E6/uL (ref 3.77–5.28)
RDW: 13.1 % (ref 11.7–15.4)
WBC: 4.6 10*3/uL (ref 3.4–10.8)

## 2020-06-20 LAB — LIPID PANEL
Chol/HDL Ratio: 4.8 ratio — ABNORMAL HIGH (ref 0.0–4.4)
Cholesterol, Total: 232 mg/dL — ABNORMAL HIGH (ref 100–199)
HDL: 48 mg/dL (ref >39)
LDL Chol Calc (NIH): 156 mg/dL — ABNORMAL HIGH (ref 0–99)
Triglycerides: 156 mg/dL — ABNORMAL HIGH (ref 0–149)
VLDL Cholesterol Cal: 28 mg/dL (ref 5–40)

## 2020-06-20 LAB — TSH: TSH: 0.943 u[IU]/mL (ref 0.450–4.500)

## 2020-06-20 LAB — CYTOLOGY - PAP: Diagnosis: NEGATIVE

## 2020-06-21 ENCOUNTER — Telehealth: Payer: Self-pay | Admitting: Family

## 2020-06-21 ENCOUNTER — Other Ambulatory Visit: Payer: Self-pay | Admitting: Family

## 2020-06-21 ENCOUNTER — Other Ambulatory Visit: Payer: Self-pay

## 2020-06-21 ENCOUNTER — Encounter: Admitting: Family

## 2020-06-21 ENCOUNTER — Ambulatory Visit
Admission: RE | Admit: 2020-06-21 | Discharge: 2020-06-21 | Disposition: A | Discharge status: Home / Self Care | Source: Ambulatory Visit | Attending: Family | Admitting: Family

## 2020-06-21 DIAGNOSIS — Z1239 Encounter for other screening for malignant neoplasm of breast: Secondary | ICD-10-CM

## 2020-06-21 NOTE — Telephone Encounter (Signed)
FYI: Pt called to let Courtney in referrals know that she would prefer to see pulmonary specialist in [facility]; not in [place]. Pt also prefers to see someone who is with [facility] if possible.

## 2020-07-18 ENCOUNTER — Telehealth: Payer: Self-pay

## 2020-07-18 NOTE — Telephone Encounter (Signed)
X-ray disc up front for patient to p/u for appt.  Patient aware

## 2020-11-14 ENCOUNTER — Telehealth: Payer: Self-pay

## 2020-11-14 DIAGNOSIS — L989 Disorder of the skin and subcutaneous tissue, unspecified: Secondary | ICD-10-CM

## 2020-11-14 NOTE — Telephone Encounter (Signed)
Referral placed.

## 2020-11-14 NOTE — Telephone Encounter (Signed)
REFERRAL REQUEST Telephone Note  Have you been seen at our office for this problem? Yes at her last phsycial (Advise that they may need an appointment with their PCP before a referral can be done)  Reason for Referral: scaly spots on skin, on foreheard and back Referral discussed with patient: yes at last appt Best contact number of patient for referral team:   [phone] Has patient been seen by a specialist for this issue before: no Patient provider preference for referral: no Patient location preference for referral: [place] does not want to go to [facility] also prefers female   Patient notified that referrals can take up to a week or longer to process. If they haven't heard anything within a week they should call back and speak with the referral department.

## 2020-11-28 ENCOUNTER — Telehealth: Payer: Self-pay | Admitting: Family

## 2022-03-18 IMAGING — MG DIGITAL SCREENING BILAT W/ TOMO W/ CAD
8 series · 9 of 24 positions shown · non-contrast
Comparison: Previous exam(s).

CLINICAL DATA: Screening.

EXAM:
DIGITAL SCREENING BILATERAL MAMMOGRAM WITH TOMO AND CAD

[R CC synth-2D]
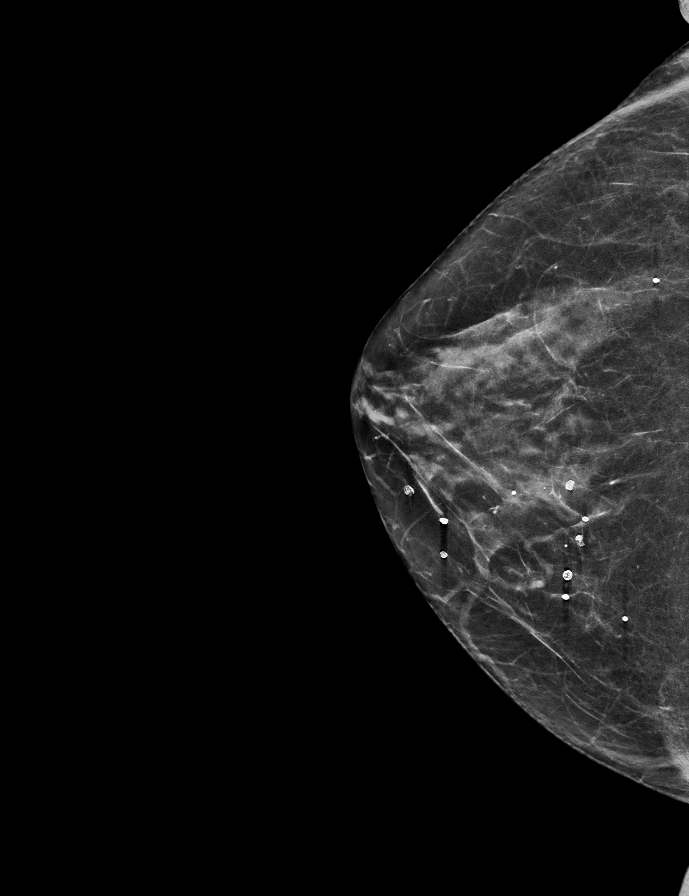

[R MLO synth-2D]
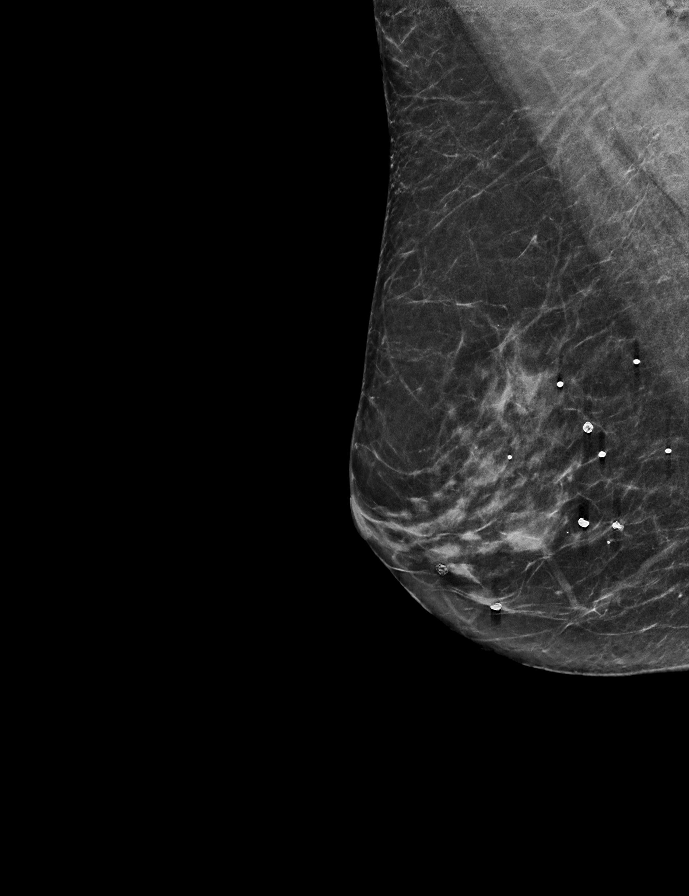

[L MLO synth-2D]
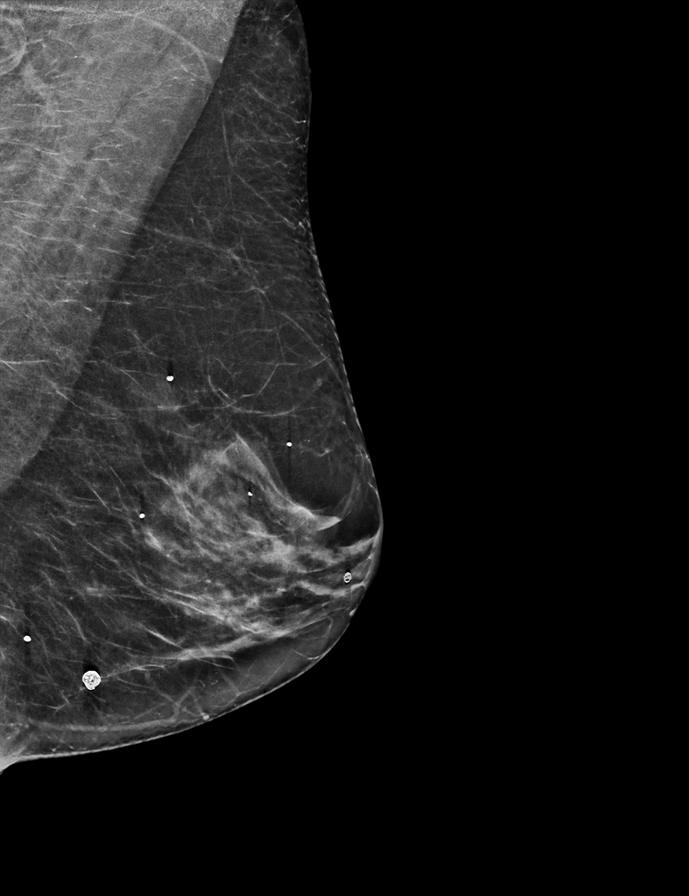

[L CC synth-2D]
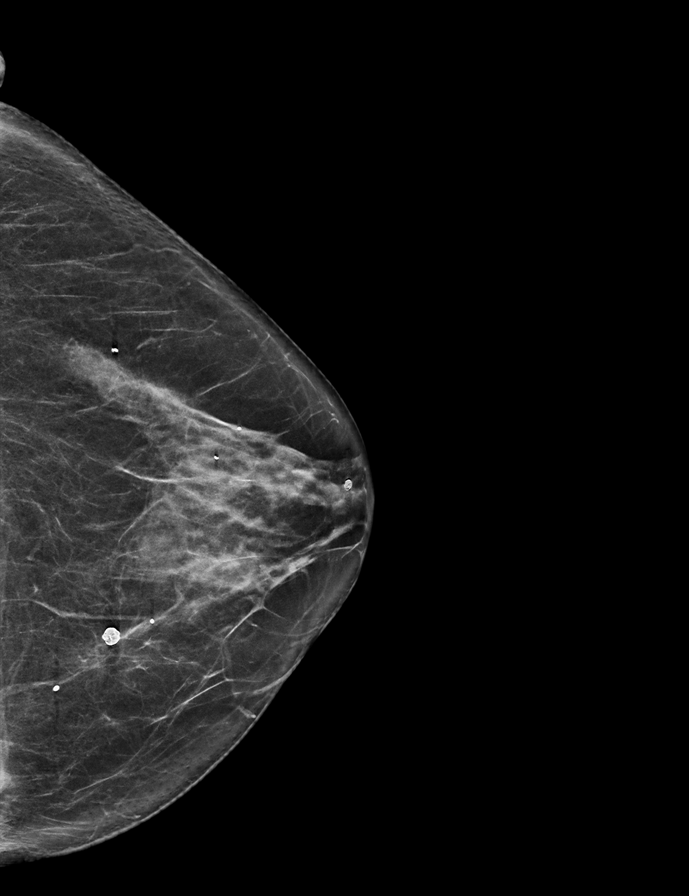

[L CC tomo · 2 of 61 frames shown]
[frame 20/61]
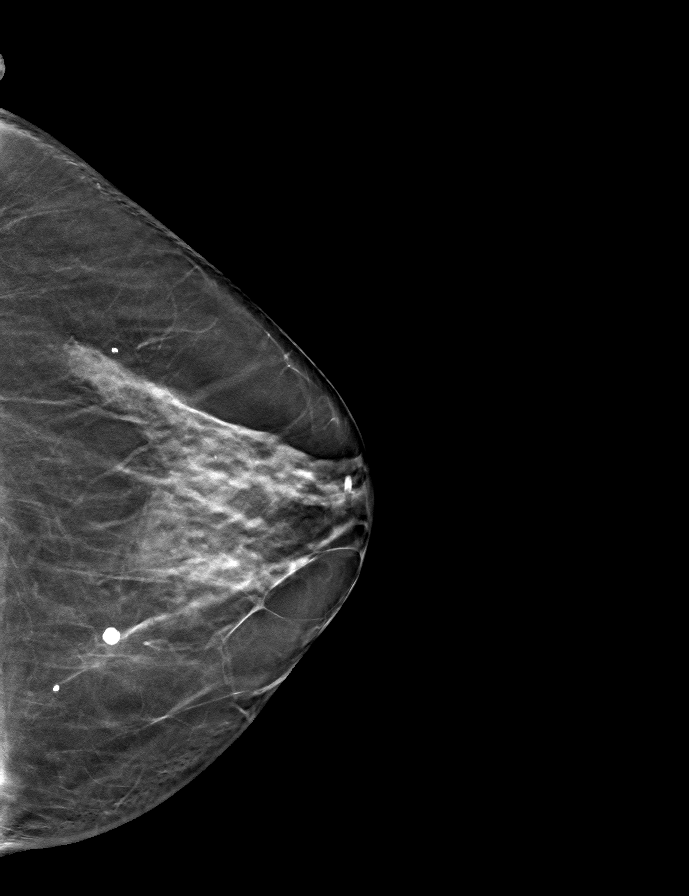
[frame 31/61]
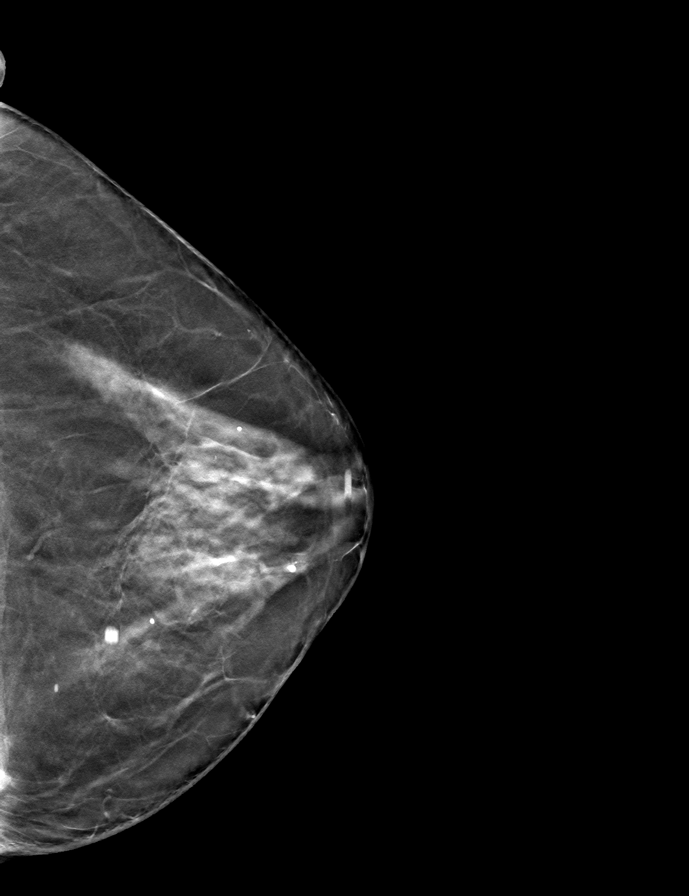

[R CC tomo · tomo slice 29/56.0]
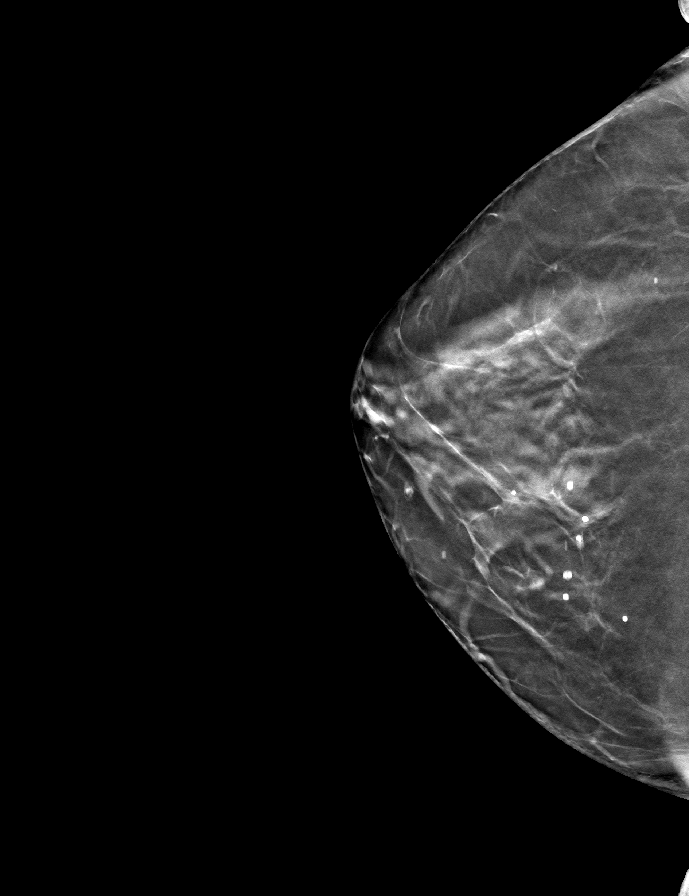

[R MLO tomo · tomo slice 27/54.0]
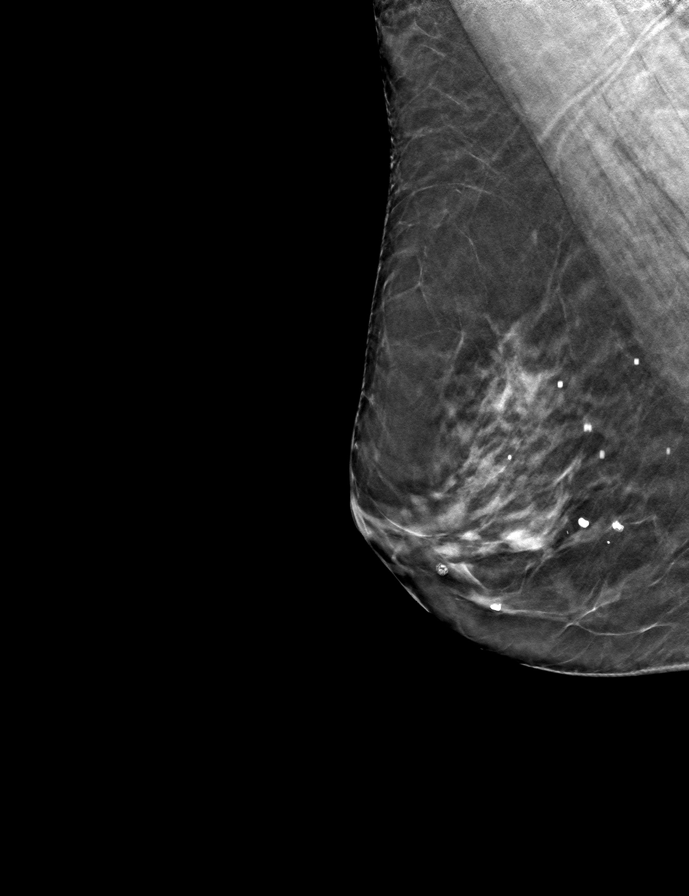

[L MLO tomo · tomo slice 29/57.0]
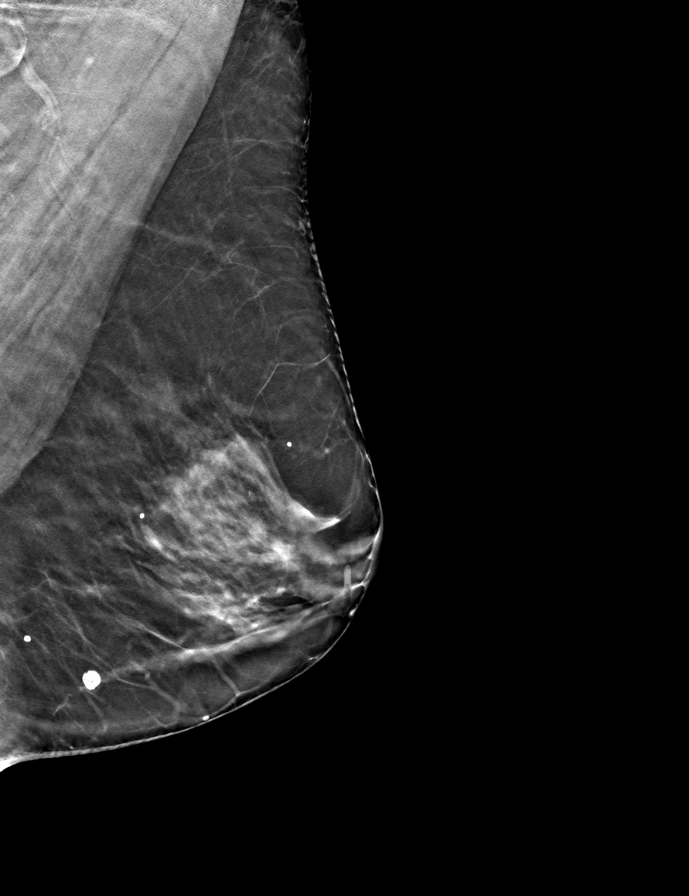

[9 of 24 positions shown; findings below may reference images not displayed]

ACR Breast Density Category c: The breast tissue is heterogeneously
dense, which may obscure small masses.
FINDINGS: There are no findings suspicious for malignancy. Images were
processed with CAD.
IMPRESSION: No mammographic evidence of malignancy. A result letter of this
screening mammogram will be mailed directly to the patient.

RECOMMENDATION:
Screening mammogram in one year. (Code:FT-U-LHB)

BI-RADS CATEGORY  1: Negative.

## 2022-06-04 ENCOUNTER — Ambulatory Visit (INDEPENDENT_AMBULATORY_CARE_PROVIDER_SITE_OTHER): Admitting: Nurse Practitioner

## 2022-06-04 ENCOUNTER — Telehealth: Payer: Self-pay | Admitting: Family

## 2022-06-04 ENCOUNTER — Encounter: Payer: Self-pay | Admitting: Nurse Practitioner

## 2022-06-04 VITALS — BP 139/89 | HR 87 | Temp 98.9°F | Ht 62.0 in | Wt 132.0 lb

## 2022-06-04 DIAGNOSIS — S61431A Puncture wound without foreign body of right hand, initial encounter: Secondary | ICD-10-CM

## 2022-06-04 DIAGNOSIS — W5501XA Bitten by cat, initial encounter: Secondary | ICD-10-CM | POA: Diagnosis not present

## 2022-06-04 MED ORDER — AMOXICILLIN-POT CLAVULANATE 875-125 MG PO TABS: 1.0000 | ORAL_TABLET | Freq: Two times a day (BID) | ORAL | 0 refills | Status: DC

## 2022-06-04 NOTE — Telephone Encounter (Signed)
Attempted to contact - NA  When she calls back let her know what Rakes said

## 2022-06-04 NOTE — Telephone Encounter (Signed)
Ok to switch.   Jannifer Rodney, FNP

## 2022-06-04 NOTE — Telephone Encounter (Signed)
Patient aware and states she will come once a year for a CPE. Tried to schedule an est care visit with Elon Jester and then have her schedule a CPE at the next appointment. Patient states that she does not want to do two visits- that she would like to do an est care visit and CPE in the same visit.  Please advise

## 2022-06-04 NOTE — Progress Notes (Signed)
Acute Office Visit  Subjective:     Patient ID: Shelby Skinner, female    DOB: 1961-10-26, 61 y.o.   MRN: [id number]  Chief Complaint  Patient presents with   Animal Bite    On hand - right - happened yesterday     Animal Bite  The incident occurred yesterday. The incident occurred at home. Head/neck injury location: hand. There is an injury to the Right hand. The pain is severe. It is unlikely that a foreign body is present. Pertinent negatives include no fussiness, no numbness, no visual disturbance, no abdominal pain, no nausea, no vomiting, no headaches, no hearing loss and no neck pain. There have been no prior injuries to these areas. She is Right-handed. Her tetanus status is UTD. She has been Behaving normally. There were no sick contacts. Recently, medical care has been given by the PCP.    Review of Systems  Constitutional: Negative.  Negative for chills and fever.  HENT:  Negative for hearing loss.   Eyes: Negative.  Negative for visual disturbance.  Respiratory: Negative.    Cardiovascular: Negative.   Gastrointestinal:  Negative for abdominal pain, nausea and vomiting.  Musculoskeletal:  Negative for neck pain.  Skin:  Negative for rash.       Bite right hand  Neurological:  Negative for numbness and headaches.  All other systems reviewed and are negative.       Objective:    BP 139/89   Pulse 87   Temp 98.9 F (37.2 C)   Ht 5\' 2"  (1.575 m)   Wt 132 lb (59.9 kg)   LMP 05/23/2013 (Approximate)   SpO2 100%   BMI 24.14 kg/m  BP Readings from Last 3 Encounters:  06/04/22 139/89  06/19/20 135/87  02/02/20 118/77   Wt Readings from Last 3 Encounters:  06/04/22 132 lb (59.9 kg)  06/19/20 130 lb (59 kg)  02/02/20 130 lb 12.8 oz (59.3 kg)      Physical Exam Vitals and nursing note reviewed.  Constitutional:      Appearance: Normal appearance.  HENT:     Head: Normocephalic.     Right Ear: External ear normal.     Left Ear: External ear normal.      Nose: Nose normal.     Mouth/Throat:     Mouth: Mucous membranes are moist.     Pharynx: Oropharynx is clear.  Eyes:     Conjunctiva/sclera: Conjunctivae normal.  Cardiovascular:     Rate and Rhythm: Normal rate and regular rhythm.     Pulses: Normal pulses.     Heart sounds: Normal heart sounds.  Pulmonary:     Effort: Pulmonary effort is normal.     Breath sounds: Normal breath sounds.  Abdominal:     General: Bowel sounds are normal.  Skin:    General: Skin is warm.     Findings: Erythema present.          Comments: Slight puncture marks/cat bite right hand  Neurological:     Mental Status: She is alert and oriented to person, place, and time.  Psychiatric:        Behavior: Behavior normal.     No results found for any visits on 06/04/22.      Assessment & Plan:  Cat bite on right hand, pain and tenderness not well controlled, swelling is gradually improving.  -Cat is a wild/stray cat per Patient. -Patient's tetanus shot is up-to-date. -Augmentin 875-125 mg tablet by mouth for 7  days -Education provided to patient on signs and symptoms of infection. -Follow-up with unresolved symptoms. Problem List Items Addressed This Visit   None Visit Diagnoses     Cat bite, initial encounter    -  Primary   Relevant Medications   amoxicillin-clavulanate (AUGMENTIN) 875-125 MG tablet       Meds ordered this encounter  Medications   amoxicillin-clavulanate (AUGMENTIN) 875-125 MG tablet    Sig: Take 1 tablet by mouth 2 (two) times daily.    Dispense:  14 tablet    Refill:  0    Order Specific Question:   Supervising Provider    Answer:   Mechele Claude [phone]    Return if symptoms worsen or fail to improve, for cat bite.  Daryll Drown, NP

## 2022-06-04 NOTE — Patient Instructions (Signed)
Animal Bite, Adult Animal bites can be mild or serious. Small bites from house pets normally are mild. Bites from cats, strays, or wild animals can be serious. If a stray or wild animal bites you, you need to get medical help right away. You may also need a shot to prevent rabies infection. What increases the risk? Being near pets you do not know. Being near animals that are eating, sleeping, or caring for their babies. Being outside where small, wild animals move freely. What are the signs or symptoms? Pain. Bleeding. Swelling. Bruising. How is this treated? Treatment may include: Cleaning your wound. Rinsing out (flushing) your wound. This uses saline solution, which is made of salt and water. Putting a bandage on your wound. Closing your wound with stitches (sutures), staples, skin glue, or skin tape (adhesive strips). Antibiotic medicine. You may be given pills, cream, gel, or fluid through an IV. A tetanus shot. Rabies treatment, if the animal could have rabies. Surgery, if there is infection or damage that needs to be fixed. Follow these instructions at home: Medicines Take or apply over-the-counter and prescription medicines only as told by your doctor. If you were prescribed an antibiotic medicine, take or apply it as told by your doctor. Do not stop using it even if your wound gets better. Wound care  Follow instructions from your doctor about how to take care of your wound. Make sure you: Wash your hands with soap and water for at least 20 seconds before and after you change your bandage. If you cannot use soap and water, use hand sanitizer. Change your bandage. Leave stitches or skin glue in place for at least 2 weeks. Leave tape strips alone unless you are told to take them off. You may trim the edges of the tape strips if they curl up. Check your wound every day for signs of infection. Check for: More redness, swelling, or pain. More fluid or blood. Warmth. Pus or a  bad smell. General instructions  Raise (elevate) the injured area above the level of your heart while you are sitting or lying down. If told, put ice on the injured area. To do this: Put ice in a plastic bag. Place a towel between your skin and the bag. Leave the ice on for 20 minutes, 2-3 times per day. Take off the ice if your skin turns bright red. This is very important. If you cannot feel pain, heat, or cold, you have a greater risk of damage to the area. Keep all follow-up visits. Contact a doctor if: You have more redness, swelling, or pain around your wound. Your wound feels warm to the touch. You have a fever or chills. You have a general feeling of sickness (malaise). You feel like you may vomit. You vomit. You have pain that does not get better. Get help right away if: You have a red streak going away from your wound. You have any of these coming from your wound: Non-clear fluid. More blood. Pus or a bad smell. You have trouble moving your injured area. You lose feeling (have numbness) or feel tingling anywhere on your body. Summary Animal bites can be mild or serious. If a stray or wild animal bites you, you need to get medical help right away. Your doctor will look at the wound and may ask about how the animal bite happened. Treatment may include wound care, antibiotic medicine, a tetanus shot, and rabies treatment. This information is not intended to replace advice given to you   by your health care provider. Make sure you discuss any questions you have with your health care provider. Document Revised: 12/14/2021 Document Reviewed: 12/14/2021 Elsevier Patient Education  2023 [employer]  

## 2022-06-05 ENCOUNTER — Encounter: Payer: Self-pay | Admitting: Family Medicine

## 2022-06-05 ENCOUNTER — Ambulatory Visit (INDEPENDENT_AMBULATORY_CARE_PROVIDER_SITE_OTHER): Admitting: Family Medicine

## 2022-06-05 VITALS — BP 121/70 | HR 89 | Temp 98.8°F | Ht 62.0 in | Wt 133.0 lb

## 2022-06-05 DIAGNOSIS — Z1329 Encounter for screening for other suspected endocrine disorder: Secondary | ICD-10-CM

## 2022-06-05 DIAGNOSIS — R0602 Shortness of breath: Secondary | ICD-10-CM

## 2022-06-05 DIAGNOSIS — Z0000 Encounter for general adult medical examination without abnormal findings: Secondary | ICD-10-CM

## 2022-06-05 DIAGNOSIS — Z0001 Encounter for general adult medical examination with abnormal findings: Secondary | ICD-10-CM

## 2022-06-05 DIAGNOSIS — Z13220 Encounter for screening for lipoid disorders: Secondary | ICD-10-CM | POA: Diagnosis not present

## 2022-06-05 DIAGNOSIS — Z8616 Personal history of COVID-19: Secondary | ICD-10-CM

## 2022-06-05 DIAGNOSIS — Z1231 Encounter for screening mammogram for malignant neoplasm of breast: Secondary | ICD-10-CM | POA: Diagnosis not present

## 2022-06-05 DIAGNOSIS — Z13228 Encounter for screening for other metabolic disorders: Secondary | ICD-10-CM

## 2022-06-05 NOTE — Progress Notes (Signed)
Subjective:  Patient ID: Shelby Skinner, female    DOB: [birth year], 61 y.o.   MRN: [id number]  Patient Care Team: Baruch Gouty, FNP as PCP - General (Family Medicine)   Chief Complaint:  Establish Care (Switch back to original provider)   HPI: Shelby Skinner is a 61 y.o. female presenting on 06/05/2022 for Establish Care (Switch back to original provider)   Pt presents today to reestablish care with former PCP and for annual physical exam. 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 She did have imaging which revealed lung scaring post COVID. She reports some days she does not have any symptoms and other days she has significant exertional shortness of breath which is relieved by rest. She lost both of her parents over the last year and has handled this well. She has a new granddaughter who lives in [place] and she travels to see her often. Her PAP is up to date. She is due for her mammogram. Will get Shingrix when available in office. She briefly moved to [place] and is now back in [place]. She is currently on Augmentin for a cat bite to her right hand.    Relevant past medical, surgical, family, and social history reviewed and updated as indicated.  Allergies and medications reviewed and updated. Data reviewed: Chart in Epic.   History reviewed. No pertinent past medical history.  Past Surgical History:  Procedure Laterality Date   APPENDECTOMY  2010   BREAST CYST ASPIRATION      Social History   Socioeconomic History   Marital status: Married    Spouse name: Not on file   Number of children: Not on file   Years of education: Not on file   Highest education level: Not on file  Occupational History   Not on file  Tobacco Use   Smoking status: Never   Smokeless tobacco: Never  Vaping Use   Vaping Use: Never used  Substance and Sexual Activity   Alcohol use: Yes    Alcohol/week: 1.0  standard drink of alcohol    Types: 1 Glasses of wine per week   Drug use: Not on file   Sexual activity: Not on file  Other Topics Concern   Not on file  Social History Narrative   Not on file   Social Determinants of Health   Financial Resource Strain: Not on file  Food Insecurity: Not on file  Transportation Needs: Not on file  Physical Activity: Not on file  Stress: Not on file  Social Connections: Not on file  Intimate Partner Violence: Not on file    Outpatient Encounter Medications as of 06/05/2022  Medication Sig   amoxicillin-clavulanate (AUGMENTIN) 875-125 MG tablet Take 1 tablet by mouth 2 (two) times daily.   cetirizine (ZYRTEC ALLERGY) 10 MG tablet    vitamin C (ASCORBIC ACID) 500 MG tablet Take 1,000 mg by mouth daily.    [DISCONTINUED] cholecalciferol (VITAMIN D) 1000 units tablet Take 1,000 Units by mouth daily.   [DISCONTINUED] Cyanocobalamin (VITAMIN B 12 PO)    [DISCONTINUED] Melatonin 10 MG TABS Take by mouth at bedtime.   No facility-administered encounter medications on file as of 06/05/2022.    Allergies  Allergen Reactions   Gluten Meal Swelling, Palpitations and Other (See Comments)    GI issues   Fluticasone Propionate Other (See Comments)    Nose bleed    Codeine Nausea Only  Other Diarrhea, Swelling and Other (See Comments)    Tree nuts   Mushroom Extract Complex Nausea Only, Palpitations and Other (See Comments)    Jelly mushrooms. Tremors, dry mouth and dizziness.     Review of Systems  Constitutional:  Negative for activity change, appetite change, chills, diaphoresis, fatigue, fever and unexpected weight change.  HENT: Negative.    Eyes: Negative.  Negative for photophobia and visual disturbance.  Respiratory:  Positive for shortness of breath (exertional). Negative for cough and chest tightness.   Cardiovascular:  Negative for chest pain, palpitations and leg swelling.  Gastrointestinal:  Negative for abdominal pain, blood in stool,  constipation, diarrhea, nausea and vomiting.  Endocrine: Negative.   Genitourinary:  Negative for decreased urine volume, difficulty urinating, dysuria, frequency and urgency.  Musculoskeletal:  Negative for arthralgias and myalgias.  Skin: Negative.   Allergic/Immunologic: Negative.   Neurological:  Negative for dizziness, tremors, seizures, syncope, facial asymmetry, speech difficulty, weakness, light-headedness, numbness and headaches.  Hematological: Negative.   Psychiatric/Behavioral:  Negative for confusion, hallucinations, sleep disturbance and suicidal ideas.   All other systems reviewed and are negative.       Objective:  BP 121/70   Pulse 89   Temp 98.8 F (37.1 C)   Ht 5' 2" (1.575 m)   Wt 133 lb (60.3 kg)   LMP 05/23/2013 (Approximate)   SpO2 98%   BMI 24.33 kg/m    Wt Readings from Last 3 Encounters:  06/05/22 133 lb (60.3 kg)  06/04/22 132 lb (59.9 kg)  06/19/20 130 lb (59 kg)    Physical Exam Vitals and nursing note reviewed.  Constitutional:      General: She is not in acute distress.    Appearance: Normal appearance. She is well-developed and well-groomed. She is not ill-appearing, toxic-appearing or diaphoretic.  HENT:     Head: Normocephalic and atraumatic.     Jaw: There is normal jaw occlusion.     Right Ear: Hearing, tympanic membrane, ear canal and external ear normal.     Left Ear: Hearing, tympanic membrane, ear canal and external ear normal.     Nose: Nose normal.     Mouth/Throat:     Lips: Pink.     Mouth: Mucous membranes are moist.     Pharynx: Oropharynx is clear. Uvula midline.  Eyes:     General: Lids are normal.     Extraocular Movements: Extraocular movements intact.     Conjunctiva/sclera: Conjunctivae normal.     Pupils: Pupils are equal, round, and reactive to light.  Neck:     Thyroid: No thyroid mass, thyromegaly or thyroid tenderness.     Vascular: No carotid bruit or JVD.     Trachea: Trachea and phonation normal.   Cardiovascular:     Rate and Rhythm: Normal rate and regular rhythm.     Chest Wall: PMI is not displaced.     Pulses: Normal pulses.     Heart sounds: Normal heart sounds. No murmur heard.    No friction rub. No gallop.  Pulmonary:     Effort: Pulmonary effort is normal. No respiratory distress.     Breath sounds: Normal breath sounds. No wheezing.  Abdominal:     General: Bowel sounds are normal. There is no distension or abdominal bruit.     Palpations: Abdomen is soft. There is no hepatomegaly or splenomegaly.     Tenderness: There is no abdominal tenderness. There is no right CVA tenderness or left CVA tenderness.  Hernia: No hernia is present.  Musculoskeletal:        General: Normal range of motion.     Right wrist: Normal.     Right hand: Swelling and tenderness present. No deformity, lacerations or bony tenderness. Normal range of motion. Normal strength. Normal strength of finger abduction, thumb/finger opposition and wrist extension. Normal sensation. There is no disruption of two-point discrimination. Normal capillary refill. Normal pulse.     Cervical back: Normal range of motion and neck supple.     Right lower leg: No edema.     Left lower leg: No edema.  Lymphadenopathy:     Cervical: No cervical adenopathy.  Skin:    General: Skin is warm and dry.     Capillary Refill: Capillary refill takes less than 2 seconds.     Coloration: Skin is not cyanotic, jaundiced or pale.     Findings: Ecchymosis and wound present. No rash.     Comments: Cat bite injury to right hand  Neurological:     General: No focal deficit present.     Mental Status: She is alert and oriented to person, place, and time.     Sensory: Sensation is intact.     Motor: Motor function is intact.     Coordination: Coordination is intact.     Gait: Gait is intact.     Deep Tendon Reflexes: Reflexes are normal and symmetric.  Psychiatric:        Attention and Perception: Attention and perception  normal.        Mood and Affect: Mood and affect normal.        Speech: Speech normal.        Behavior: Behavior normal. Behavior is cooperative.        Thought Content: Thought content normal.        Cognition and Memory: Cognition and memory normal.        Judgment: Judgment normal.     Results for orders placed or performed in visit on 06/19/20  CMP14+EGFR  Result Value Ref Range   Glucose 103 (H) 65 - 99 mg/dL   BUN 12 6 - 24 mg/dL   Creatinine, Ser 0.84 0.57 - 1.00 mg/dL   GFR calc non Af Amer 77 >59 mL/min/1.73   GFR calc Af Amer 89 >59 mL/min/1.73   BUN/Creatinine Ratio 14 9 - 23   Sodium 140 134 - 144 mmol/L   Potassium 4.2 3.5 - 5.2 mmol/L   Chloride 102 96 - 106 mmol/L   CO2 22 20 - 29 mmol/L   Calcium 9.7 8.7 - 10.2 mg/dL   Total Protein 7.0 6.0 - 8.5 g/dL   Albumin 4.8 3.8 - 4.9 g/dL   Globulin, Total 2.2 1.5 - 4.5 g/dL   Albumin/Globulin Ratio 2.2 1.2 - 2.2   Bilirubin Total 0.4 0.0 - 1.2 mg/dL   Alkaline Phosphatase 71 48 - 121 IU/L   AST 23 0 - 40 IU/L   ALT 12 0 - 32 IU/L  CBC with Differential/Platelet  Result Value Ref Range   WBC 4.6 3.4 - 10.8 x10E3/uL   RBC 4.62 3.77 - 5.28 x10E6/uL   Hemoglobin 13.4 11.1 - 15.9 g/dL   Hematocrit 40.0 34.0 - 46.6 %   MCV 87 79 - 97 fL   MCH 29.0 26.6 - 33.0 pg   MCHC 33.5 31.5 - 35.7 g/dL   RDW 13.1 11.7 - 15.4 %   Platelets 218 150 - 450 x10E3/uL   Neutrophils 61  Not Estab. %   Lymphs 29 Not Estab. %   Monocytes 7 Not Estab. %   Eos 2 Not Estab. %   Basos 1 Not Estab. %   Neutrophils Absolute 2.8 1.4 - 7.0 x10E3/uL   Lymphocytes Absolute 1.3 0.7 - 3.1 x10E3/uL   Monocytes Absolute 0.3 0.1 - 0.9 x10E3/uL   EOS (ABSOLUTE) 0.1 0.0 - 0.4 x10E3/uL   Basophils Absolute 0.1 0.0 - 0.2 x10E3/uL   Immature Granulocytes 0 Not Estab. %   Immature Grans (Abs) 0.0 0.0 - 0.1 x10E3/uL  Lipid panel  Result Value Ref Range   Cholesterol, Total 232 (H) 100 - 199 mg/dL   Triglycerides 156 (H) 0 - 149 mg/dL   HDL 48 >39  mg/dL   VLDL Cholesterol Cal 28 5 - 40 mg/dL   LDL Chol Calc (NIH) 156 (H) 0 - 99 mg/dL   Chol/HDL Ratio 4.8 (H) 0.0 - 4.4 ratio  TSH  Result Value Ref Range   TSH 0.943 0.450 - 4.500 uIU/mL  HM COLONOSCOPY  Result Value Ref Range   HM Colonoscopy See Report (in chart) See Report (in chart), Patient Reported  Cytology - PAP([place])  Result Value Ref Range   Adequacy      Satisfactory for evaluation. The presence or absence of an   Adequacy      endocervical/transformation zone component cannot be determined because   Adequacy of atrophy.    Diagnosis      - Negative for intraepithelial lesion or malignancy (NILM)       Pertinent labs & imaging results that were available during my care of the patient were reviewed by me and considered in my medical decision making.  Assessment & Plan:  Shelby Skinner was seen today for establish care.  Diagnoses and all orders for this visit:  Annual physical exam Health maintenance discussed in detail. Labs and mammogram ordered.  -     CBC with Differential/Platelet -     CMP14+EGFR -     Lipid panel -     Thyroid Panel With TSH -     MM Digital Screening; Future -     ECHOCARDIOGRAM COMPLETE; Future  Encounter for screening mammogram for malignant neoplasm of breast -     MM Digital Screening; Future  Screening for lipid disorders -     Lipid panel  Screening for endocrine disorder -     CMP14+EGFR -     Thyroid Panel With TSH  Screening for metabolic disorder -     BUL84+TXMI -     Thyroid Panel With TSH  Exertional shortness of breath History of 2019 novel coronavirus disease (COVID-19) Ongoing exertional shortness of breath post COVID infection. Imaging of lungs revealed scarring but no other acute findings. Will obtain echo to evaluate for structural heart disease / damage post infection.  -     ECHOCARDIOGRAM COMPLETE; Future     Continue all other maintenance medications.  Follow up plan: Return in 1 year (on  06/06/2023), or if symptoms worsen or fail to improve, for CPE.   Continue healthy lifestyle choices, including diet (rich in fruits, vegetables, and lean proteins, and low in salt and simple carbohydrates) and exercise (at least 30 minutes of moderate physical activity daily).  Educational handout given for health maintenance  The above assessment and management plan was discussed with the patient. The patient verbalized understanding of and has agreed to the management plan. Patient is aware to call the clinic if they develop  any new symptoms or if symptoms persist or worsen. Patient is aware when to return to the clinic for a follow-up visit. Patient educated on when it is appropriate to go to the emergency department.   Monia Pouch, FNP-C [facility] [phone]

## 2022-06-06 LAB — CMP14+EGFR
ALT: 12 IU/L (ref 0–32)
AST: 20 IU/L (ref 0–40)
Albumin/Globulin Ratio: 2.4 — ABNORMAL HIGH (ref 1.2–2.2)
Albumin: 4.7 g/dL (ref 3.8–4.9)
Alkaline Phosphatase: 78 IU/L (ref 44–121)
BUN/Creatinine Ratio: 9 — ABNORMAL LOW (ref 12–28)
BUN: 8 mg/dL (ref 8–27)
Bilirubin Total: 0.2 mg/dL (ref 0.0–1.2)
CO2: 24 mmol/L (ref 20–29)
Calcium: 9.6 mg/dL (ref 8.7–10.3)
Chloride: 103 mmol/L (ref 96–106)
Creatinine, Ser: 0.85 mg/dL (ref 0.57–1.00)
Globulin, Total: 2 g/dL (ref 1.5–4.5)
Glucose: 80 mg/dL (ref 70–99)
Potassium: 4.1 mmol/L (ref 3.5–5.2)
Sodium: 143 mmol/L (ref 134–144)
Total Protein: 6.7 g/dL (ref 6.0–8.5)
eGFR: 78 mL/min/{1.73_m2} (ref >59)

## 2022-06-06 LAB — CBC WITH DIFFERENTIAL/PLATELET
Basophils Absolute: 0.1 10*3/uL (ref 0.0–0.2)
Basos: 1 %
EOS (ABSOLUTE): 0.2 10*3/uL (ref 0.0–0.4)
Eos: 3 %
Hematocrit: 37.5 % (ref 34.0–46.6)
Hemoglobin: 12.8 g/dL (ref 11.1–15.9)
Immature Grans (Abs): 0 10*3/uL (ref 0.0–0.1)
Immature Granulocytes: 0 %
Lymphocytes Absolute: 1.7 10*3/uL (ref 0.7–3.1)
Lymphs: 29 %
MCH: 29.2 pg (ref 26.6–33.0)
MCHC: 34.1 g/dL (ref 31.5–35.7)
MCV: 85 fL (ref 79–97)
Monocytes Absolute: 0.4 10*3/uL (ref 0.1–0.9)
Monocytes: 7 %
Neutrophils Absolute: 3.5 10*3/uL (ref 1.4–7.0)
Neutrophils: 60 %
Platelets: 240 10*3/uL (ref 150–450)
RBC: 4.39 x10E6/uL (ref 3.77–5.28)
RDW: 12.7 % (ref 11.7–15.4)
WBC: 5.8 10*3/uL (ref 3.4–10.8)

## 2022-06-06 LAB — LIPID PANEL
Chol/HDL Ratio: 5.1 ratio — ABNORMAL HIGH (ref 0.0–4.4)
Cholesterol, Total: 203 mg/dL — ABNORMAL HIGH (ref 100–199)
HDL: 40 mg/dL (ref >39)
LDL Chol Calc (NIH): 115 mg/dL — ABNORMAL HIGH (ref 0–99)
Triglycerides: 274 mg/dL — ABNORMAL HIGH (ref 0–149)
VLDL Cholesterol Cal: 48 mg/dL — ABNORMAL HIGH (ref 5–40)

## 2022-06-06 LAB — THYROID PANEL WITH TSH
Free Thyroxine Index: 1.9 (ref 1.2–4.9)
T3 Uptake Ratio: 24 % (ref 24–39)
T4, Total: 7.9 ug/dL (ref 4.5–12.0)
TSH: 0.942 u[IU]/mL (ref 0.450–4.500)

## 2022-06-07 NOTE — Telephone Encounter (Signed)
Patient seen in clinic

## 2022-06-11 ENCOUNTER — Other Ambulatory Visit: Payer: Self-pay | Admitting: Family

## 2022-06-11 ENCOUNTER — Other Ambulatory Visit: Payer: Self-pay | Admitting: Family Medicine

## 2022-06-11 ENCOUNTER — Telehealth: Payer: Self-pay | Admitting: Family Medicine

## 2022-06-11 DIAGNOSIS — Z1231 Encounter for screening mammogram for malignant neoplasm of breast: Secondary | ICD-10-CM

## 2022-06-11 DIAGNOSIS — W5501XA Bitten by cat, initial encounter: Secondary | ICD-10-CM

## 2022-06-11 MED ORDER — DOXYCYCLINE HYCLATE 100 MG PO TABS: 100.0000 mg | ORAL_TABLET | Freq: Two times a day (BID) | ORAL | 0 refills | Status: AC

## 2022-06-11 NOTE — Telephone Encounter (Signed)
Patient aware.

## 2022-06-17 ENCOUNTER — Ambulatory Visit
Admission: RE | Admit: 2022-06-17 | Discharge: 2022-06-17 | Disposition: A | Discharge status: Home / Self Care | Source: Ambulatory Visit | Attending: Family Medicine | Admitting: Family Medicine

## 2022-06-17 DIAGNOSIS — Z1231 Encounter for screening mammogram for malignant neoplasm of breast: Secondary | ICD-10-CM

## 2022-06-19 ENCOUNTER — Ambulatory Visit (HOSPITAL_COMMUNITY)
Admission: RE | Admit: 2022-06-19 | Discharge: 2022-06-19 | Disposition: A | Discharge status: Home / Self Care | Source: Ambulatory Visit | Attending: Family Medicine | Admitting: Family Medicine

## 2022-06-19 DIAGNOSIS — Z8616 Personal history of COVID-19: Secondary | ICD-10-CM | POA: Diagnosis present

## 2022-06-19 DIAGNOSIS — Z0000 Encounter for general adult medical examination without abnormal findings: Secondary | ICD-10-CM | POA: Diagnosis present

## 2022-06-19 DIAGNOSIS — R0602 Shortness of breath: Secondary | ICD-10-CM | POA: Insufficient documentation

## 2022-06-19 LAB — ECHOCARDIOGRAM COMPLETE
AR max vel: 1.76 cm2
AV Area VTI: 1.71 cm2
AV Area mean vel: 1.68 cm2
AV Mean grad: 4.2 mmHg
AV Peak grad: 8.1 mmHg
Ao pk vel: 1.42 m/s
Area-P 1/2: 4.6 cm2
S' Lateral: 2.9 cm

## 2022-06-19 NOTE — Progress Notes (Signed)
*  PRELIMINARY RESULTS* Echocardiogram 2D Echocardiogram has been performed.  Shelby Skinner 06/19/2022, 12:21 PM

## 2022-06-27 ENCOUNTER — Encounter: Admitting: Family Medicine

## 2023-06-10 ENCOUNTER — Telehealth: Payer: Self-pay | Admitting: Family Medicine

## 2023-06-10 ENCOUNTER — Ambulatory Visit (INDEPENDENT_AMBULATORY_CARE_PROVIDER_SITE_OTHER): Admitting: Family Medicine

## 2023-06-10 ENCOUNTER — Ambulatory Visit (INDEPENDENT_AMBULATORY_CARE_PROVIDER_SITE_OTHER)

## 2023-06-10 ENCOUNTER — Encounter: Payer: Self-pay | Admitting: Family Medicine

## 2023-06-10 VITALS — BP 137/75 | HR 68 | Temp 97.5°F | Ht 62.0 in | Wt 125.6 lb

## 2023-06-10 DIAGNOSIS — S80862A Insect bite (nonvenomous), left lower leg, initial encounter: Secondary | ICD-10-CM | POA: Diagnosis not present

## 2023-06-10 DIAGNOSIS — Z1283 Encounter for screening for malignant neoplasm of skin: Secondary | ICD-10-CM

## 2023-06-10 DIAGNOSIS — M479 Spondylosis, unspecified: Secondary | ICD-10-CM | POA: Diagnosis not present

## 2023-06-10 DIAGNOSIS — E559 Vitamin D deficiency, unspecified: Secondary | ICD-10-CM | POA: Diagnosis not present

## 2023-06-10 DIAGNOSIS — G8929 Other chronic pain: Secondary | ICD-10-CM

## 2023-06-10 DIAGNOSIS — Z0001 Encounter for general adult medical examination with abnormal findings: Secondary | ICD-10-CM

## 2023-06-10 DIAGNOSIS — W57XXXA Bitten or stung by nonvenomous insect and other nonvenomous arthropods, initial encounter: Secondary | ICD-10-CM | POA: Diagnosis not present

## 2023-06-10 DIAGNOSIS — M542 Cervicalgia: Secondary | ICD-10-CM

## 2023-06-10 DIAGNOSIS — Z0000 Encounter for general adult medical examination without abnormal findings: Secondary | ICD-10-CM

## 2023-06-10 LAB — ANEMIA PROFILE B
Hematocrit: 38.7 % (ref 34.0–46.6)
Iron Saturation: 26 % (ref 15–55)
Platelets: 239 10*3/uL (ref 150–450)
WBC: 4.6 10*3/uL (ref 3.4–10.8)

## 2023-06-10 LAB — SPOTTED FEVER GROUP ANTIBODIES

## 2023-06-10 LAB — VITAMIN D 25 HYDROXY (VIT D DEFICIENCY, FRACTURES)

## 2023-06-10 LAB — THYROID PANEL WITH TSH

## 2023-06-10 NOTE — Telephone Encounter (Signed)
REFERRAL REQUEST Telephone Note  Have you been seen at our office for this problem? Seen today forgot to tell Marcelino Duster she has a few spots on her skin, forehead andl legs (Advise that they may need an appointment with their PCP before a referral can be done)  Reason for Referral: dermatology Referral discussed with patient: yes  Best contact number of patient for referral team: [phone]    Has patient been seen by a specialist for this issue before: no  Patient provider preference for referral: no Patient location preference for referral: prefers close to home   Patient notified that referrals can take up to a week or longer to process. If they haven't heard anything within a week they should call back and speak with the referral department.

## 2023-06-10 NOTE — Progress Notes (Addendum)
Subjective:  Patient ID: Shelby Skinner, female    DOB: 1961-03-18, 62 y.o.   MRN: [id number]  Patient Care Team: Sonny Masters, FNP as PCP - General (Family Medicine)   Chief Complaint:  Annual Exam   HPI: Shelby Skinner is a 62 y.o. female presenting on 06/10/2023 for Annual Exam  Pt presents today for her annual physical exam. She has been doing well overall. Continues to have lower back, bilateral hip pain, and neck pain. States she was going to chiropractor in the past with some relief of symptoms. States she has been doing well with exercises to help. Does have intermittent bilateral hip pain. Does report some paresthesias to hands at times. No loss of bowel of bladder, no saddle anesthesia. No new injuries but has been busy with moving and building a new house. Has been able to mitigate the pain with exercises. She also reports she recently removed a tick from her left lower leg. She has been experiencing some brain fog and malaise.    Relevant past medical, surgical, family, and social history reviewed and updated as indicated.  Allergies and medications reviewed and updated. Data reviewed: Chart in Epic.   History reviewed. No pertinent past medical history.  Past Surgical History:  Procedure Laterality Date   APPENDECTOMY  2010   BREAST CYST ASPIRATION      Social History   Socioeconomic History   Marital status: Married    Spouse name: Not on file   Number of children: Not on file   Years of education: Not on file   Highest education level: Not on file  Occupational History   Not on file  Tobacco Use   Smoking status: Never   Smokeless tobacco: Never  Vaping Use   Vaping Use: Never used  Substance and Sexual Activity   Alcohol use: Yes    Alcohol/week: 1.0 standard drink of alcohol    Types: 1 Glasses of wine per week   Drug use: Not on file   Sexual activity: Not on file  Other Topics Concern   Not on file  Social History Narrative   Not on file    Social Determinants of Health   Financial Resource Strain: Not on file  Food Insecurity: Not on file  Transportation Needs: Not on file  Physical Activity: Not on file  Stress: Not on file  Social Connections: Not on file  Intimate Partner Violence: Not on file    Outpatient Encounter Medications as of 06/10/2023  Medication Sig   cetirizine (ZYRTEC ALLERGY) 10 MG tablet    vitamin C (ASCORBIC ACID) 500 MG tablet Take 1,000 mg by mouth daily.    No facility-administered encounter medications on file as of 06/10/2023.    Allergies  Allergen Reactions   Gluten Meal Swelling, Palpitations and Other (See Comments)    GI issues   Fluticasone Propionate Other (See Comments)    Nose bleed    Codeine Nausea Only   Other Diarrhea, Swelling and Other (See Comments)    Tree nuts   Mushroom Extract Complex Nausea Only, Palpitations and Other (See Comments)    Jelly mushrooms. Tremors, dry mouth and dizziness.     Review of Systems  Constitutional:  Negative for activity change, appetite change, chills, diaphoresis, fatigue, fever and unexpected weight change.  HENT: Negative.    Eyes: Negative.  Negative for photophobia and visual disturbance.  Respiratory:  Negative for cough, chest tightness and shortness of breath.   Cardiovascular:  Negative for chest pain, palpitations and leg swelling.  Gastrointestinal:  Negative for abdominal pain, blood in stool, constipation, diarrhea, nausea and vomiting.  Endocrine: Negative.  Negative for polydipsia, polyphagia and polyuria.  Genitourinary:  Negative for decreased urine volume, difficulty urinating, dysuria, frequency and urgency.  Musculoskeletal:  Positive for arthralgias, back pain and neck pain. Negative for gait problem, joint swelling and myalgias.  Skin: Negative.   Allergic/Immunologic: Negative.   Neurological:  Negative for dizziness, tremors, seizures, syncope, facial asymmetry, speech difficulty, weakness, light-headedness,  numbness and headaches.  Hematological: Negative.   Psychiatric/Behavioral:  Negative for confusion, hallucinations, sleep disturbance and suicidal ideas.         Objective:  BP 137/75   Pulse 68   Temp (!) 97.5 F (36.4 C) (Temporal)   Ht 5\' 2"  (1.575 m)   Wt 125 lb 9.6 oz (57 kg)   LMP 05/23/2013 (Approximate)   SpO2 100%   BMI 22.97 kg/m    Wt Readings from Last 3 Encounters:  06/10/23 125 lb 9.6 oz (57 kg)  06/05/22 133 lb (60.3 kg)  06/04/22 132 lb (59.9 kg)    Physical Exam Vitals and nursing note reviewed.  Constitutional:      General: She is not in acute distress.    Appearance: Normal appearance. She is well-developed, well-groomed and normal weight. She is not ill-appearing, toxic-appearing or diaphoretic.  HENT:     Head: Normocephalic and atraumatic.     Jaw: There is normal jaw occlusion.     Right Ear: Hearing, tympanic membrane, ear canal and external ear normal.     Left Ear: Hearing, tympanic membrane, ear canal and external ear normal.     Nose: Nose normal.     Mouth/Throat:     Lips: Pink.     Mouth: Mucous membranes are moist.     Pharynx: Oropharynx is clear. Uvula midline.  Eyes:     General: Lids are normal.     Extraocular Movements: Extraocular movements intact.     Conjunctiva/sclera: Conjunctivae normal.     Pupils: Pupils are equal, round, and reactive to light.  Neck:     Thyroid: No thyroid mass, thyromegaly or thyroid tenderness.     Vascular: No carotid bruit or JVD.     Trachea: Trachea and phonation normal.  Cardiovascular:     Rate and Rhythm: Normal rate and regular rhythm.     Chest Wall: PMI is not displaced.     Pulses: Normal pulses.     Heart sounds: Normal heart sounds. No murmur heard.    No friction rub. No gallop.  Pulmonary:     Effort: Pulmonary effort is normal. No respiratory distress.     Breath sounds: Normal breath sounds. No wheezing.  Abdominal:     Palpations: Abdomen is soft.  Musculoskeletal:         General: Normal range of motion.     Cervical back: Normal range of motion and neck supple.     Right lower leg: No edema.     Left lower leg: No edema.  Lymphadenopathy:     Cervical: No cervical adenopathy.  Skin:    General: Skin is warm and dry.     Capillary Refill: Capillary refill takes less than 2 seconds.     Coloration: Skin is not cyanotic, jaundiced or pale.     Findings: No rash.  Neurological:     General: No focal deficit present.     Mental Status: She is  alert and oriented to person, place, and time.     Sensory: Sensation is intact.     Motor: Motor function is intact.     Coordination: Coordination is intact.     Gait: Gait is intact.     Deep Tendon Reflexes: Reflexes are normal and symmetric.  Psychiatric:        Attention and Perception: Attention and perception normal.        Mood and Affect: Mood and affect normal.        Speech: Speech normal.        Behavior: Behavior normal. Behavior is cooperative.        Thought Content: Thought content normal.        Cognition and Memory: Cognition and memory normal.        Judgment: Judgment normal.     Results for orders placed or performed during the hospital encounter of 06/19/22  ECHOCARDIOGRAM COMPLETE  Result Value Ref Range   Area-P 1/2 4.60 cm2   S' Lateral 2.90 cm   AR max vel 1.76 cm2   AV Area mean vel 1.68 cm2   AV Area VTI 1.71 cm2   AV Peak grad 8.1 mmHg   Ao pk vel 1.42 m/s   AV Mean grad 4.2 mmHg       Pertinent labs & imaging results that were available during my care of the patient were reviewed by me and considered in my medical decision making.  Assessment & Plan:  Bellatrix was seen today for annual exam.  Diagnoses and all orders for this visit:  Annual physical exam Diet and exercise encouraged. Health maintenance discussed in detail. Labs pending.  -     CMP14+EGFR -     Cancel: CBC with Differential/Platelet -     Lipid panel -     Thyroid Panel With TSH -     Anemia  Profile B -     VITAMIN D 25 Hydroxy (Vit-D Deficiency, Fractures)  Neck pain, chronic Spondylosis  Pt would like to have a referral to PT. Imaging today as pt is having some intermittent paresthesias in her hands and worsening hop pain at times. No red flags concerning for cauda equina syndrome. Aware of red flags which require emergent evaluation and treatment.  -     DG Cervical Spine Complete -     Ambulatory referral to Physical Therapy -     DG Lumbar Spine 2-3 Views  Vitamin D deficiency Labs pending. Continue repletion therapy. If indicated, will change repletion dosage. Eat foods rich in Vit D including milk, orange juice, yogurt with vitamin D added, salmon or mackerel, canned tuna fish, cereals with vitamin D added, and cod liver oil. Get out in the sun but make sure to wear at least SPF 30 sunscreen.  -     VITAMIN D 25 Hydroxy (Vit-D Deficiency, Fractures)  Tick bite of left lower leg, initial encounter Will check for possible tick borne illnesses.  -     Spotted Fever Group Antibodies -     Lyme Disease Serology w/Reflex     Continue all other maintenance medications.  Follow up plan: Return in about 1 year (around 06/09/2024), or if symptoms worsen or fail to improve, for CPE.   Continue healthy lifestyle choices, including diet (rich in fruits, vegetables, and lean proteins, and low in salt and simple carbohydrates) and exercise (at least 30 minutes of moderate physical activity daily).  Educational handout given for health maintenance  The above assessment and management plan was discussed with the patient. The patient verbalized understanding of and has agreed to the management plan. Patient is aware to call the clinic if they develop any new symptoms or if symptoms persist or worsen. Patient is aware when to return to the clinic for a follow-up visit. Patient educated on when it is appropriate to go to the emergency department.   Kari Baars, FNP-C [facility] [phone]

## 2023-06-11 LAB — ANEMIA PROFILE B
Basophils Absolute: 0.1 10*3/uL (ref 0.0–0.2)
EOS (ABSOLUTE): 0.2 10*3/uL (ref 0.0–0.4)
Lymphocytes Absolute: 1.3 10*3/uL (ref 0.7–3.1)
MCV: 85 fL (ref 79–97)
Total Iron Binding Capacity: 389 ug/dL (ref 250–450)

## 2023-06-11 NOTE — Addendum Note (Signed)
Addended by: Sonny Masters on: 06/11/2023 04:16 PM   Modules accepted: Orders

## 2023-06-11 NOTE — Telephone Encounter (Signed)
Patient aware and verbalizes understanding. 

## 2023-06-11 NOTE — Telephone Encounter (Signed)
Referral placed.

## 2023-06-12 LAB — ANEMIA PROFILE B
Hemoglobin: 13 g/dL (ref 11.1–15.9)
Immature Grans (Abs): 0 10*3/uL (ref 0.0–0.1)
Immature Granulocytes: 0 %
MCHC: 33.6 g/dL (ref 31.5–35.7)
Neutrophils Absolute: 2.7 10*3/uL (ref 1.4–7.0)
RDW: 13.2 % (ref 11.7–15.4)

## 2023-06-12 LAB — THYROID PANEL WITH TSH: Free Thyroxine Index: 2.5 (ref 1.2–4.9)

## 2023-06-17 ENCOUNTER — Ambulatory Visit: Admitting: Physical Therapy

## 2023-06-20 ENCOUNTER — Other Ambulatory Visit: Payer: Self-pay

## 2023-06-20 ENCOUNTER — Ambulatory Visit: Attending: Family Medicine | Admitting: Physical Therapy

## 2023-06-20 DIAGNOSIS — M62838 Other muscle spasm: Secondary | ICD-10-CM | POA: Diagnosis present

## 2023-06-20 DIAGNOSIS — M5459 Other low back pain: Secondary | ICD-10-CM

## 2023-06-20 DIAGNOSIS — M479 Spondylosis, unspecified: Secondary | ICD-10-CM | POA: Insufficient documentation

## 2023-06-20 DIAGNOSIS — M542 Cervicalgia: Secondary | ICD-10-CM | POA: Diagnosis present

## 2023-06-20 NOTE — Therapy (Signed)
OUTPATIENT PHYSICAL THERAPY SPINE EVALUATION   Patient Name: Shelby Skinner MRN: [id number] DOB:1961/06/13, 62 y.o., female Today's Date: 06/20/2023  END OF SESSION:  PT End of Session - 06/20/23 1309     Visit Number 1    Number of Visits 12    Date for PT Re-Evaluation 08/01/23    PT Start Time 1017    PT Stop Time 1053    PT Time Calculation (min) 36 min    Activity Tolerance Patient tolerated treatment well    Behavior During Therapy [facility] for tasks assessed/performed             No past medical history on file. Past Surgical History:  Procedure Laterality Date   APPENDECTOMY  2010   BREAST CYST ASPIRATION     Patient Active Problem List   Diagnosis Date Noted   Vitamin D deficiency 06/10/2023   Neck pain, chronic 06/10/2023   Spondylosis 02/23/2019   REFERRING PROVIDER: Gilford Silvius  REFERRING DIAG: Spondylosis.  Rationale for Evaluation and Treatment: Rehabilitation  THERAPY DIAG:  Cervicalgia - Plan: PT plan of care cert/re-cert  Other low back pain - Plan: PT plan of care cert/re-cert  Other muscle spasm - Plan: PT plan of care cert/re-cert  ONSET DATE: "Years ago"  SUBJECTIVE:                                                                                                                                                                                           SUBJECTIVE STATEMENT: The patient presents to the clinic with chronic neck and low back pain.  Her pain is mild today and she reports more of a feeling of "tension."  She has also been experiencing paresthesia into bilateral UE's.   She experiences lower lumbar pain with radiation into bilateral hips.  The patient leads a very active lifestyle and she is very motivated to improve.  Repositioning and stretching  helps decrease her pain.  Lifting and prolonged sitting increases pain.    PERTINENT HISTORY:  H/o spinal pain.    PAIN:  Are you having pain? Mild today.  Ache,  "tension".  PRECAUTIONS: Other: Spondylolisthesis.  WEIGHT BEARING RESTRICTIONS: No  FALLS:  Has patient fallen in last 6 months? No  LIVING ENVIRONMENT: Lives with: lives with their spouse Lives in: House/apartment Has following equipment at home: None  OCCUPATION: Retired.  PLOF: Independent  PATIENT GOALS: Decrease tension, do more with less pain.  OBJECTIVE:   DIAGNOSTIC FINDINGS:   04/12/19:  IMPRESSION: Abnormal MRI scan cervical spine showing abnormal curvature as well as prominent disc degenerative changes from C3-C6 but without significant compression.  No  abnormal enhancing lesions are noted.   06/14/23:  IMPRESSION: 1. Moderate facet hypertrophy at L3-L4, L4-L5 and L5-S1. 2. Grade 1 anterolisthesis of L4 on L5 and L5 on S1.  06/14/23:  IMPRESSION: 1. Degenerative disc disease from C4-C5 through C6-C7. 2. Moderate C7-T1 facet hypertrophy. Lesser facet hypertrophy at C4-C5. 3. C4-C5 bony neural foraminal narrowing on the right. 4. Mild scoliosis. Trace retrolisthesis of C6 on C7 and anterolisthesis of C7 on T1.    PATIENT SURVEYS:  FOTO .   POSTURE: rounded shoulders, forward head, and decreased lordosis.  PALPATION: "Tension" reported over cervical paraspinal musculature.  Increased tone over bilateral UT's and [employer].  Pain c/o in L5-S1 and bilateral SIJ regions radiating into both hips.  LUMBAR/CERVICAL ROM:  Full active flexion.  Active right cervical rotation is 68 degrees and left is 65 degrees.      LOWER EXTREMITY MMT:   Patient able to provide normal strength grade via MMTing for major muscle groups of bilateral U and LE's.  LUMBAR SPECIAL TESTS:  Equal leg lengths. (-) SLR testing. (-) FABER  and Sacral Press testing.   Absent right Achilles reflex.  GAIT: WNL.   ASSESSMENT:  CLINICAL IMPRESSION: The patient presents to [facility]PPT with c/o chronic neck and low back pain and "tension."  She has c/o cervical paraspinal musculature pain as  well as over both SIJ's and radiation into both hips.  Her right Achilles was absent.  She has some loss of bilateral active cervical rotation.  Special testing is negative.  She has c/o paraesthesia into bilateral hands.  She is very motivated to improve.  He has excellent range of motion into active lumbar flexion.   Patient will benefit from skilled physical therapy intervention to address pain and deficits.   OBJECTIVE IMPAIRMENTS: decreased activity tolerance, decreased ROM, increased muscle spasms, postural dysfunction, and pain.   ACTIVITY LIMITATIONS: sitting  PERSONAL FACTORS: Time since onset of injury/illness/exacerbation are also affecting patient's functional outcome.   REHAB POTENTIAL: Good  CLINICAL DECISION MAKING: Evolving/moderate complexity  EVALUATION COMPLEXITY: Low   GOALS:  LONG TERM GOALS: Target date: 08/01/23  Ind with an HEP.  Goal status: INITIAL  2.  Sitting 30 minutes with pain not > 3/10.  Goal status: INITIAL  3.  Perform ADL's with pain not > 3/10.  Goal status: INITIAL  PLAN:  PT FREQUENCY: 2x/week  PT DURATION: 6 weeks  PLANNED INTERVENTIONS: Therapeutic exercises, Therapeutic activity, Patient/Family education, Self Care, Dry Needling, Electrical stimulation, Cryotherapy, Moist heat, Ultrasound, and Manual therapy.  PLAN FOR NEXT SESSION: Chin tucks and extension, core exercise progression mindful of spondylolistesis, spinal protection techniques, body mechanics training.  Modalities and STW/M as needed.     Enjoli Tidd, [place], PT 06/20/2023, 1:37 PM

## 2023-06-21 LAB — ANEMIA PROFILE B
Basos: 1 %
Eos: 5 %
Ferritin: 76 ng/mL (ref 15–150)
Iron: 102 ug/dL (ref 27–139)
Lymphs: 28 %
MCH: 28.5 pg (ref 26.6–33.0)
Monocytes Absolute: 0.3 10*3/uL (ref 0.1–0.9)
Monocytes: 7 %
Neutrophils: 59 %
RBC: 4.56 x10E6/uL (ref 3.77–5.28)
Retic Ct Pct: 1 % (ref 0.6–2.6)
UIBC: 287 ug/dL (ref 118–369)
Vitamin B-12: 442 pg/mL (ref 232–1245)

## 2023-06-21 LAB — CMP14+EGFR
ALT: 15 IU/L (ref 0–32)
AST: 31 IU/L (ref 0–40)
Albumin: 4.7 g/dL (ref 3.9–4.9)
Alkaline Phosphatase: 78 IU/L (ref 44–121)
BUN/Creatinine Ratio: 12 (ref 12–28)
BUN: 10 mg/dL (ref 8–27)
Bilirubin Total: 0.7 mg/dL (ref 0.0–1.2)
CO2: 25 mmol/L (ref 20–29)
Calcium: 9.9 mg/dL (ref 8.7–10.3)
Chloride: 103 mmol/L (ref 96–106)
Creatinine, Ser: 0.86 mg/dL (ref 0.57–1.00)
Globulin, Total: 2.4 g/dL (ref 1.5–4.5)
Glucose: 91 mg/dL (ref 70–99)
Potassium: 4.9 mmol/L (ref 3.5–5.2)
Sodium: 141 mmol/L (ref 134–144)
Total Protein: 7.1 g/dL (ref 6.0–8.5)
eGFR: 77 mL/min/{1.73_m2} (ref >59)

## 2023-06-21 LAB — THYROID PANEL WITH TSH
T3 Uptake Ratio: 28 % (ref 24–39)
TSH: 0.718 u[IU]/mL (ref 0.450–4.500)

## 2023-06-21 LAB — LIPID PANEL
Chol/HDL Ratio: 4.2 ratio (ref 0.0–4.4)
Cholesterol, Total: 203 mg/dL — ABNORMAL HIGH (ref 100–199)
HDL: 48 mg/dL (ref >39)
LDL Chol Calc (NIH): 136 mg/dL — ABNORMAL HIGH (ref 0–99)
Triglycerides: 106 mg/dL (ref 0–149)
VLDL Cholesterol Cal: 19 mg/dL (ref 5–40)

## 2023-06-21 LAB — SPOTTED FEVER GROUP ANTIBODIES
Spotted Fever Group IgG: <1:64 {titer}
Spotted Fever Group IgM: <1:64 {titer}

## 2023-06-21 LAB — LYME DISEASE SEROLOGY W/REFLEX: Lyme Total Antibody EIA: NEGATIVE

## 2023-06-24 ENCOUNTER — Ambulatory Visit: Attending: Family Medicine | Admitting: *Deleted

## 2023-06-24 ENCOUNTER — Encounter: Payer: Self-pay | Admitting: *Deleted

## 2023-06-24 DIAGNOSIS — M5459 Other low back pain: Secondary | ICD-10-CM | POA: Insufficient documentation

## 2023-06-24 DIAGNOSIS — M542 Cervicalgia: Secondary | ICD-10-CM | POA: Diagnosis present

## 2023-06-24 DIAGNOSIS — M62838 Other muscle spasm: Secondary | ICD-10-CM | POA: Diagnosis present

## 2023-06-24 NOTE — Therapy (Signed)
OUTPATIENT PHYSICAL THERAPY SPINE EVALUATION   Patient Name: Shelby Skinner MRN: [id number] DOB:[birth year], 62 y.o., female Today's Date: 06/24/2023  END OF SESSION:  PT End of Session - 06/24/23 0932     Visit Number 2    Number of Visits 12    Date for PT Re-Evaluation 08/01/23    PT Start Time 0933    PT Stop Time 1019    PT Time Calculation (min) 46 min             History reviewed. No pertinent past medical history. Past Surgical History:  Procedure Laterality Date   APPENDECTOMY  2010   BREAST CYST ASPIRATION     Patient Active Problem List   Diagnosis Date Noted   Vitamin D deficiency 06/10/2023   Neck pain, chronic 06/10/2023   Spondylosis 02/23/2019   REFERRING PROVIDER: Gilford Silvius  REFERRING DIAG: Spondylosis.  Rationale for Evaluation and Treatment: Rehabilitation  THERAPY DIAG:  Cervicalgia  Other low back pain  Other muscle spasm  ONSET DATE: "Years ago"  SUBJECTIVE:                                                                                                                                                                                           SUBJECTIVE STATEMENT: Doing better today.No radiating pain    PERTINENT HISTORY:  H/o spinal pain.    PAIN:  Are you having pain? Mild today.  Ache, "tension".  PRECAUTIONS: Other: Spondylolisthesis.  WEIGHT BEARING RESTRICTIONS: No  FALLS:  Has patient fallen in last 6 months? No  LIVING ENVIRONMENT: Lives with: lives with their spouse Lives in: House/apartment Has following equipment at home: None  OCCUPATION: Retired.  PLOF: Independent  PATIENT GOALS: Decrease tension, do more with less pain.  OBJECTIVE:   DIAGNOSTIC FINDINGS:   04/12/19:  IMPRESSION: Abnormal MRI scan cervical spine showing abnormal curvature as well as prominent disc degenerative changes from C3-C6 but without significant compression.  No abnormal enhancing lesions are noted.   06/14/23:   IMPRESSION: 1. Moderate facet hypertrophy at L3-L4, L4-L5 and L5-S1. 2. Grade 1 anterolisthesis of L4 on L5 and L5 on S1.  06/14/23:  IMPRESSION: 1. Degenerative disc disease from C4-C5 through C6-C7. 2. Moderate C7-T1 facet hypertrophy. Lesser facet hypertrophy at C4-C5. 3. C4-C5 bony neural foraminal narrowing on the right. 4. Mild scoliosis. Trace retrolisthesis of C6 on C7 and anterolisthesis of C7 on T1.    PATIENT SURVEYS:  FOTO .   POSTURE: rounded shoulders, forward head, and decreased lordosis.  PALPATION: "Tension" reported over cervical paraspinal musculature.  Increased tone over bilateral UT's and [employer].  Pain c/o  in L5-S1 and bilateral SIJ regions radiating into both hips.  LUMBAR/CERVICAL ROM:  Full active flexion.  Active right cervical rotation is 68 degrees and left is 65 degrees.      LOWER EXTREMITY MMT:   Patient able to provide normal strength grade via MMTing for major muscle groups of bilateral U and LE's.  LUMBAR SPECIAL TESTS:  Equal leg lengths. (-) SLR testing. (-) FABER  and Sacral Press testing.   Absent right Achilles reflex.  GAIT: WNL. TREATMENT Date:                                                                  06-24-23 ASSESSMENT:                                    EXERCISE LOG  Exercise Repetitions and Resistance Comments  Chin tucks x10 Pt reports no stretch or pain                   Blank cell = exercise not performed today  Modalities: [place] combo x 12 mins (6 mins each side) Bil UT's and cervical paras. Manual: STW /TPR to Bil UT's as well as cervical paras  Discussed and reviewed AB bracing for transitional movements and ADL's to decrease pain triggers. Also discussed farmer's carry and suitcase carry.     CLINICAL IMPRESSION: Pt arrived today doing fair with neck and LBP. Rx focused more on Pt's neck pain today with [place] combo and STW , but was also educated on AB bracing with transitional movements and ADL's to  decrease pain triggers. Next Rx work on core stability exs     OBJECTIVE IMPAIRMENTS: decreased activity tolerance, decreased ROM, increased muscle spasms, postural dysfunction, and pain.   ACTIVITY LIMITATIONS: sitting  PERSONAL FACTORS: Time since onset of injury/illness/exacerbation are also affecting patient's functional outcome.   REHAB POTENTIAL: Good  CLINICAL DECISION MAKING: Evolving/moderate complexity  EVALUATION COMPLEXITY: Low   GOALS:  LONG TERM GOALS: Target date: 08/01/23  Ind with an HEP.  Goal status: INITIAL  2.  Sitting 30 minutes with pain not > 3/10.  Goal status: INITIAL  3.  Perform ADL's with pain not > 3/10.  Goal status: INITIAL  PLAN:  PT FREQUENCY: 2x/week  PT DURATION: 6 weeks  PLANNED INTERVENTIONS: Therapeutic exercises, Therapeutic activity, Patient/Family education, Self Care, Dry Needling, Electrical stimulation, Cryotherapy, Moist heat, Ultrasound, and Manual therapy.  PLAN FOR NEXT SESSION: Chin tucks and extension, core exercise progression mindful of spondylolistesis, spinal protection techniques, body mechanics training.  Modalities and STW/M as needed.     Cherlyn Syring,CHRIS, PTA 06/24/2023, 12:01 PM

## 2023-06-27 ENCOUNTER — Ambulatory Visit: Admitting: *Deleted

## 2023-06-27 DIAGNOSIS — M62838 Other muscle spasm: Secondary | ICD-10-CM

## 2023-06-27 DIAGNOSIS — M542 Cervicalgia: Secondary | ICD-10-CM

## 2023-06-27 DIAGNOSIS — M5459 Other low back pain: Secondary | ICD-10-CM

## 2023-06-27 NOTE — Therapy (Signed)
OUTPATIENT PHYSICAL THERAPY SPINE TREATMENT   Patient Name: Shelby Skinner MRN: [id number] DOB:Mar 13, 1961, 62 y.o., female Today's Date: 06/27/2023  END OF SESSION:  PT End of Session - 06/27/23 1014     Visit Number 3    Number of Visits 12    Date for PT Re-Evaluation 08/01/23    PT Start Time 1015    PT Stop Time 1105    PT Time Calculation (min) 50 min             No past medical history on file. Past Surgical History:  Procedure Laterality Date   APPENDECTOMY  2010   BREAST CYST ASPIRATION     Patient Active Problem List   Diagnosis Date Noted   Vitamin D deficiency 06/10/2023   Neck pain, chronic 06/10/2023   Spondylosis 02/23/2019   REFERRING PROVIDER: Gilford Silvius  REFERRING DIAG: Spondylosis.  Rationale for Evaluation and Treatment: Rehabilitation  THERAPY DIAG:  Cervicalgia  Other low back pain  Other muscle spasm  ONSET DATE: "Years ago"  SUBJECTIVE:                                                                                                                                                                                           SUBJECTIVE STATEMENT: Did good after last visit.    PERTINENT HISTORY:  H/o spinal pain.    PAIN:  Are you having pain? Mild today.  Ache, "tension".  PRECAUTIONS: Other: Spondylolisthesis.  WEIGHT BEARING RESTRICTIONS: No  FALLS:  Has patient fallen in last 6 months? No  LIVING ENVIRONMENT: Lives with: lives with their spouse Lives in: House/apartment Has following equipment at home: None  OCCUPATION: Retired.  PLOF: Independent  PATIENT GOALS: Decrease tension, do more with less pain.  OBJECTIVE:   DIAGNOSTIC FINDINGS:   04/12/19:  IMPRESSION: Abnormal MRI scan cervical spine showing abnormal curvature as well as prominent disc degenerative changes from C3-C6 but without significant compression.  No abnormal enhancing lesions are noted.   06/14/23:  IMPRESSION: 1. Moderate facet hypertrophy  at L3-L4, L4-L5 and L5-S1. 2. Grade 1 anterolisthesis of L4 on L5 and L5 on S1.  06/14/23:  IMPRESSION: 1. Degenerative disc disease from C4-C5 through C6-C7. 2. Moderate C7-T1 facet hypertrophy. Lesser facet hypertrophy at C4-C5. 3. C4-C5 bony neural foraminal narrowing on the right. 4. Mild scoliosis. Trace retrolisthesis of C6 on C7 and anterolisthesis of C7 on T1.    PATIENT SURVEYS:  FOTO .   POSTURE: rounded shoulders, forward head, and decreased lordosis.  PALPATION: "Tension" reported over cervical paraspinal musculature.  Increased tone over bilateral UT's and [employer].  Pain c/o in  L5-S1 and bilateral SIJ regions radiating into both hips.  LUMBAR/CERVICAL ROM:  Full active flexion.  Active right cervical rotation is 68 degrees and left is 65 degrees.      LOWER EXTREMITY MMT:   Patient able to provide normal strength grade via MMTing for major muscle groups of bilateral U and LE's.  LUMBAR SPECIAL TESTS:  Equal leg lengths. (-) SLR testing. (-) FABER  and Sacral Press testing.   Absent right Achilles reflex.  GAIT: WNL. TREATMENT Date:                                                                  06-27-23 ASSESSMENT:                                    EXERCISE LOG  Exercise Repetitions and Resistance Comments  Dying Bug X6, hold 10 secs each side   Bridge X 6 hold 10 secs   Sidelying SLR X6 hold 10 secs Bil   Cat/ camel Reviewed, Pt to try at home   Standing modified       Bird-dog UE X6 hold 10 sec, LE hold 10 secs  Bil.   Squats Reviewed, Pt to try at home    Blank cell = exercise not performed today  Modalities:  Manual:   Discussed and reviewed AB bracing for transitional movements and ADL's to decrease pain triggers. Also discussed farmer's carry and suitcase carry.     CLINICAL IMPRESSION: Pt arrived today reporting doing well after last Rx for her neck. Today's Rx focused on core activation and strengthening exs on the mat as well as  standing. Handouts given for HEP. Mainly tightness end of session.     OBJECTIVE IMPAIRMENTS: decreased activity tolerance, decreased ROM, increased muscle spasms, postural dysfunction, and pain.   ACTIVITY LIMITATIONS: sitting  PERSONAL FACTORS: Time since onset of injury/illness/exacerbation are also affecting patient's functional outcome.   REHAB POTENTIAL: Good  CLINICAL DECISION MAKING: Evolving/moderate complexity  EVALUATION COMPLEXITY: Low   GOALS:  LONG TERM GOALS: Target date: 08/01/23  Ind with an HEP.  Goal status: Partially met  2.  Sitting 30 minutes with pain not > 3/10.  Goal status: INITIAL  3.  Perform ADL's with pain not > 3/10.  Goal status: INITIAL  PLAN:  PT FREQUENCY: 2x/week  PT DURATION: 6 weeks  PLANNED INTERVENTIONS: Therapeutic exercises, Therapeutic activity, Patient/Family education, Self Care, Dry Needling, Electrical stimulation, Cryotherapy, Moist heat, Ultrasound, and Manual therapy.  PLAN FOR NEXT SESSION: TPR to QL.  Chin tucks and extension, core exercise progression mindful of spondylolistesis, spinal protection techniques, body mechanics training.  Modalities and STW/M as needed.     Ingrid Shifrin,CHRIS, PTA 06/27/2023, 11:35 AM

## 2023-07-04 ENCOUNTER — Ambulatory Visit: Admitting: *Deleted

## 2023-07-08 ENCOUNTER — Ambulatory Visit: Admitting: *Deleted

## 2023-07-08 ENCOUNTER — Encounter: Payer: Self-pay | Admitting: *Deleted

## 2023-07-08 DIAGNOSIS — M542 Cervicalgia: Secondary | ICD-10-CM | POA: Diagnosis not present

## 2023-07-08 DIAGNOSIS — M62838 Other muscle spasm: Secondary | ICD-10-CM

## 2023-07-08 DIAGNOSIS — M5459 Other low back pain: Secondary | ICD-10-CM

## 2023-07-08 NOTE — Therapy (Signed)
OUTPATIENT PHYSICAL THERAPY SPINE TREATMENT   Patient Name: Shelby Skinner MRN: [id number] DOB:1960/12/25, 62 y.o., female Today's Date: 07/08/2023  END OF SESSION:  PT End of Session - 07/08/23 0936     Visit Number 4    Number of Visits 12    Date for PT Re-Evaluation 08/01/23    PT Start Time 0935    PT Stop Time 1025    PT Time Calculation (min) 50 min             History reviewed. No pertinent past medical history. Past Surgical History:  Procedure Laterality Date   APPENDECTOMY  2010   BREAST CYST ASPIRATION     Patient Active Problem List   Diagnosis Date Noted   Vitamin D deficiency 06/10/2023   Neck pain, chronic 06/10/2023   Spondylosis 02/23/2019   REFERRING PROVIDER: Gilford Silvius  REFERRING DIAG: Spondylosis.  Rationale for Evaluation and Treatment: Rehabilitation  THERAPY DIAG:  Cervicalgia  Other low back pain  Other muscle spasm  ONSET DATE: "Years ago"  SUBJECTIVE:                                                                                                                                                                                           SUBJECTIVE STATEMENT: Did good after last visit. Able to do some exs in the floor now. Bird dog still irritates LT side.    PERTINENT HISTORY:  H/o spinal pain.    PAIN:  Are you having pain? Mild today.  Ache, "tension".  PRECAUTIONS: Other: Spondylolisthesis.  WEIGHT BEARING RESTRICTIONS: No  FALLS:  Has patient fallen in last 6 months? No  LIVING ENVIRONMENT: Lives with: lives with their spouse Lives in: House/apartment Has following equipment at home: None  OCCUPATION: Retired.  PLOF: Independent  PATIENT GOALS: Decrease tension, do more with less pain.  OBJECTIVE:   DIAGNOSTIC FINDINGS:   04/12/19:  IMPRESSION: Abnormal MRI scan cervical spine showing abnormal curvature as well as prominent disc degenerative changes from C3-C6 but without significant compression.  No  abnormal enhancing lesions are noted.   06/14/23:  IMPRESSION: 1. Moderate facet hypertrophy at L3-L4, L4-L5 and L5-S1. 2. Grade 1 anterolisthesis of L4 on L5 and L5 on S1.  06/14/23:  IMPRESSION: 1. Degenerative disc disease from C4-C5 through C6-C7. 2. Moderate C7-T1 facet hypertrophy. Lesser facet hypertrophy at C4-C5. 3. C4-C5 bony neural foraminal narrowing on the right. 4. Mild scoliosis. Trace retrolisthesis of C6 on C7 and anterolisthesis of C7 on T1.    PATIENT SURVEYS:  FOTO .   POSTURE: rounded shoulders, forward head, and decreased lordosis.  PALPATION: "Tension" reported over  cervical paraspinal musculature.  Increased tone over bilateral UT's and [employer].  Pain c/o in L5-S1 and bilateral SIJ regions radiating into both hips.  LUMBAR/CERVICAL ROM:  Full active flexion.  Active right cervical rotation is 68 degrees and left is 65 degrees.      LOWER EXTREMITY MMT:   Patient able to provide normal strength grade via MMTing for major muscle groups of bilateral U and LE's.  LUMBAR SPECIAL TESTS:  Equal leg lengths. (-) SLR testing. (-) FABER  and Sacral Press testing.   Absent right Achilles reflex.  GAIT: WNL. TREATMENT Date:                                                                  07-08-23 ASSESSMENT:                                    EXERCISE LOG  Exercise Repetitions and Resistance Comments  Dying Bug    Bridge    Sidelying SLR    Cat/ camel    Standing modified       Bird-dog UE X6 hold 5 sec, LE hold 5 secs  Bil. Decreased hold time and cues to not extend LE as much  Squats         Blank cell = exercise not performed today  Modalities:  [place] combo x 10 mins to Bil. Utraps 1.5 w/cm2  Manual:  Reviewed current HEP technique Discussed and reviewed AB bracing for transitional movements and ADL's to decrease pain triggers. Also discussed farmer's carry and suitcase carry.     CLINICAL IMPRESSION: Pt arrived today reporting doing well  after last Rx , but some pain with bird-dog in LT side LB as well as UT tension. Today's Rx focused on review of core activation and strengthening exs. She was able to perform modified bird-dog without pain today and decreased UT mm tension end of session.     OBJECTIVE IMPAIRMENTS: decreased activity tolerance, decreased ROM, increased muscle spasms, postural dysfunction, and pain.   ACTIVITY LIMITATIONS: sitting  PERSONAL FACTORS: Time since onset of injury/illness/exacerbation are also affecting patient's functional outcome.   REHAB POTENTIAL: Good  CLINICAL DECISION MAKING: Evolving/moderate complexity  EVALUATION COMPLEXITY: Low   GOALS:  LONG TERM GOALS: Target date: 08/01/23  Ind with an HEP.  Goal status: Partially met  2.  Sitting 30 minutes with pain not > 3/10.  Goal status: INITIAL  3.  Perform ADL's with pain not > 3/10.  Goal status: INITIAL  PLAN:  PT FREQUENCY: 2x/week  PT DURATION: 6 weeks  PLANNED INTERVENTIONS: Therapeutic exercises, Therapeutic activity, Patient/Family education, Self Care, Dry Needling, Electrical stimulation, Cryotherapy, Moist heat, Ultrasound, and Manual therapy.  PLAN FOR NEXT SESSION: TPR to QL.  Chin tucks and extension, core exercise progression mindful of spondylolistesis, spinal protection techniques, body mechanics training.  Modalities and STW/M as needed.     Jahzier Villalon,CHRIS, PTA 07/08/2023, 5:12 PM

## 2023-07-11 ENCOUNTER — Ambulatory Visit: Admitting: *Deleted

## 2023-07-11 ENCOUNTER — Encounter: Payer: Self-pay | Admitting: *Deleted

## 2023-07-11 DIAGNOSIS — M62838 Other muscle spasm: Secondary | ICD-10-CM

## 2023-07-11 DIAGNOSIS — M542 Cervicalgia: Secondary | ICD-10-CM | POA: Diagnosis not present

## 2023-07-11 DIAGNOSIS — M5459 Other low back pain: Secondary | ICD-10-CM

## 2023-07-11 NOTE — Therapy (Signed)
OUTPATIENT PHYSICAL THERAPY SPINE TREATMENT   Patient Name: Shelby Skinner MRN: [id number] DOB:05-Feb-1961, 62 y.o., female Today's Date: 07/11/2023  END OF SESSION:  PT End of Session - 07/11/23 0933     Visit Number 5    Number of Visits 12    Date for PT Re-Evaluation 08/01/23    PT Start Time 0933    PT Stop Time 1020    PT Time Calculation (min) 47 min             History reviewed. No pertinent past medical history. Past Surgical History:  Procedure Laterality Date   APPENDECTOMY  2010   BREAST CYST ASPIRATION     Patient Active Problem List   Diagnosis Date Noted   Vitamin D deficiency 06/10/2023   Neck pain, chronic 06/10/2023   Spondylosis 02/23/2019   REFERRING PROVIDER: Gilford Silvius  REFERRING DIAG: Spondylosis.  Rationale for Evaluation and Treatment: Rehabilitation  THERAPY DIAG:  Cervicalgia  Other low back pain  Other muscle spasm  ONSET DATE: "Years ago"  SUBJECTIVE:                                                                                                                                                                                           SUBJECTIVE STATEMENT: Didn't  get much relief after last Rx    PERTINENT HISTORY:  H/o spinal pain.    PAIN:  Are you having pain? Mild today.  Ache, "tension".  PRECAUTIONS: Other: Spondylolisthesis.  WEIGHT BEARING RESTRICTIONS: No  FALLS:  Has patient fallen in last 6 months? No  LIVING ENVIRONMENT: Lives with: lives with their spouse Lives in: House/apartment Has following equipment at home: None  OCCUPATION: Retired.  PLOF: Independent  PATIENT GOALS: Decrease tension, do more with less pain.  OBJECTIVE:   DIAGNOSTIC FINDINGS:   04/12/19:  IMPRESSION: Abnormal MRI scan cervical spine showing abnormal curvature as well as prominent disc degenerative changes from C3-C6 but without significant compression.  No abnormal enhancing lesions are noted.   06/14/23:   IMPRESSION: 1. Moderate facet hypertrophy at L3-L4, L4-L5 and L5-S1. 2. Grade 1 anterolisthesis of L4 on L5 and L5 on S1.  06/14/23:  IMPRESSION: 1. Degenerative disc disease from C4-C5 through C6-C7. 2. Moderate C7-T1 facet hypertrophy. Lesser facet hypertrophy at C4-C5. 3. C4-C5 bony neural foraminal narrowing on the right. 4. Mild scoliosis. Trace retrolisthesis of C6 on C7 and anterolisthesis of C7 on T1.    PATIENT SURVEYS:  FOTO .   POSTURE: rounded shoulders, forward head, and decreased lordosis.  PALPATION: "Tension" reported over cervical paraspinal musculature.  Increased tone over bilateral UT's and [employer].  Pain c/o in L5-S1 and bilateral SIJ regions radiating into both hips.  LUMBAR/CERVICAL ROM:  Full active flexion.  Active right cervical rotation is 68 degrees and left is 65 degrees.      LOWER EXTREMITY MMT:   Patient able to provide normal strength grade via MMTing for major muscle groups of bilateral U and LE's.  LUMBAR SPECIAL TESTS:  Equal leg lengths. (-) SLR testing. (-) FABER  and Sacral Press testing.   Absent right Achilles reflex.  GAIT: WNL. TREATMENT Date:                                                                  07-11-23 ASSESSMENT:                                    EXERCISE LOG  Exercise Repetitions and Resistance Comments  Dying Bug    Bridge    Sidelying SLR    Cat/ camel    Standing modified       Bird-dog UE X6 hold 5 sec, LE hold 5 secs  Bil. Decreased hold time and cues to not extend LE as much  Squats         Blank cell = exercise not performed today  Modalities:  [place] combo x 10 mins to Bil. Utraps 1.5 w/cm2  Manual: STW/ TPR to bil. UT's as well as cerv. paras Reviewed current HEP technique      CLINICAL IMPRESSION: Pt arrived today reporting doing okay after last Rx, but less relief than the first time. Today's Rx focused on review of core activation and strengthening exs.,  but then focused on her neck due  to Bil. Tightness/ pain. [place] combo and STW to both sides UT's and Cerv paraswith good release.    OBJECTIVE IMPAIRMENTS: decreased activity tolerance, decreased ROM, increased muscle spasms, postural dysfunction, and pain.   ACTIVITY LIMITATIONS: sitting  PERSONAL FACTORS: Time since onset of injury/illness/exacerbation are also affecting patient's functional outcome.   REHAB POTENTIAL: Good  CLINICAL DECISION MAKING: Evolving/moderate complexity  EVALUATION COMPLEXITY: Low   GOALS:  LONG TERM GOALS: Target date: 08/01/23  Ind with an HEP.  Goal status: Partially met  2.  Sitting 30 minutes with pain not > 3/10.  Goal status: INITIAL  3.  Perform ADL's with pain not > 3/10.  Goal status: INITIAL  PLAN:  PT FREQUENCY: 2x/week  PT DURATION: 6 weeks  PLANNED INTERVENTIONS: Therapeutic exercises, Therapeutic activity, Patient/Family education, Self Care, Dry Needling, Electrical stimulation, Cryotherapy, Moist heat, Ultrasound, and Manual therapy.  PLAN FOR NEXT SESSION: TPR to QL.  Chin tucks and extension, core exercise progression mindful of spondylolistesis, spinal protection techniques, body mechanics training.  Modalities and STW/M as needed.     Calogero Geisen,CHRIS, PTA 07/11/2023, 1:20 PM

## 2023-07-15 ENCOUNTER — Encounter: Payer: Self-pay | Admitting: *Deleted

## 2023-07-15 ENCOUNTER — Ambulatory Visit: Admitting: *Deleted

## 2023-07-15 DIAGNOSIS — M62838 Other muscle spasm: Secondary | ICD-10-CM

## 2023-07-15 DIAGNOSIS — M542 Cervicalgia: Secondary | ICD-10-CM | POA: Diagnosis not present

## 2023-07-15 DIAGNOSIS — M5459 Other low back pain: Secondary | ICD-10-CM

## 2023-07-15 NOTE — Therapy (Signed)
OUTPATIENT PHYSICAL THERAPY SPINE TREATMENT   Patient Name: Shelby Skinner MRN: [id number] DOB:26-Jun-1961, 62 y.o., female Today's Date: 07/15/2023  END OF SESSION:  PT End of Session - 07/15/23 0931     Visit Number 6    Number of Visits 12    Date for PT Re-Evaluation 08/01/23    PT Start Time 0930    PT Stop Time 1020    PT Time Calculation (min) 50 min             History reviewed. No pertinent past medical history. Past Surgical History:  Procedure Laterality Date   APPENDECTOMY  2010   BREAST CYST ASPIRATION     Patient Active Problem List   Diagnosis Date Noted   Vitamin D deficiency 06/10/2023   Neck pain, chronic 06/10/2023   Spondylosis 02/23/2019   REFERRING PROVIDER: Gilford Silvius  REFERRING DIAG: Spondylosis.  Rationale for Evaluation and Treatment: Rehabilitation  THERAPY DIAG:  Cervicalgia  Other low back pain  Other muscle spasm  ONSET DATE: "Years ago"  SUBJECTIVE:                                                                                                                                                                                           SUBJECTIVE STATEMENT: Did good after last Rx. AB bracing really helps. LT side UT is tight today    PERTINENT HISTORY:  H/o spinal pain.    PAIN:  Are you having pain? Mild today.  Ache, "tension".  PRECAUTIONS: Other: Spondylolisthesis.  WEIGHT BEARING RESTRICTIONS: No  FALLS:  Has patient fallen in last 6 months? No  LIVING ENVIRONMENT: Lives with: lives with their spouse Lives in: House/apartment Has following equipment at home: None  OCCUPATION: Retired.  PLOF: Independent  PATIENT GOALS: Decrease tension, do more with less pain.  OBJECTIVE:   DIAGNOSTIC FINDINGS:   04/12/19:  IMPRESSION: Abnormal MRI scan cervical spine showing abnormal curvature as well as prominent disc degenerative changes from C3-C6 but without significant compression.  No abnormal enhancing  lesions are noted.   06/14/23:  IMPRESSION: 1. Moderate facet hypertrophy at L3-L4, L4-L5 and L5-S1. 2. Grade 1 anterolisthesis of L4 on L5 and L5 on S1.  06/14/23:  IMPRESSION: 1. Degenerative disc disease from C4-C5 through C6-C7. 2. Moderate C7-T1 facet hypertrophy. Lesser facet hypertrophy at C4-C5. 3. C4-C5 bony neural foraminal narrowing on the right. 4. Mild scoliosis. Trace retrolisthesis of C6 on C7 and anterolisthesis of C7 on T1.    PATIENT SURVEYS:  FOTO .   POSTURE: rounded shoulders, forward head, and decreased lordosis.  PALPATION: "Tension" reported over cervical paraspinal musculature.  Increased  tone over bilateral UT's and [employer].  Pain c/o in L5-S1 and bilateral SIJ regions radiating into both hips.  LUMBAR/CERVICAL ROM:  Full active flexion.  Active right cervical rotation is 68 degrees and left is 65 degrees.      LOWER EXTREMITY MMT:   Patient able to provide normal strength grade via MMTing for major muscle groups of bilateral U and LE's.  LUMBAR SPECIAL TESTS:  Equal leg lengths. (-) SLR testing. (-) FABER  and Sacral Press testing.   Absent right Achilles reflex.  GAIT: WNL. TREATMENT Date:                                                                  07-15-23 ASSESSMENT:                                    EXERCISE LOG  Exercise Repetitions and Resistance Comments  Dying Bug    Bridge    Sidelying SLR    Cat/ camel    Standing modified       Bird-dog UE X6 hold 5 sec, LE hold 5 secs  Bil. Decreased hold time and cues to not extend LE as much  Squats    Tband Rows Green  2x10 hold 5 secs   Tband Shldr Ext Green  2x10    Blank cell = exercise not performed today  Reviewed all home exercises and added Tband rows  and shldr ext to HEP. Handout given. Modalities:   Manual: STW/ TPR Bil. cerv. Paras with Pt seated.       CLINICAL IMPRESSION: Pt arrived today reporting doing good after last Rx and can tell AB bracing is really   helping during transitional movements and when lifting.Rx focused on review of HEP with added Tband core and posture exs. Tband and handout given. STW also performed to Bil. Cervical paras with good release. LTG's for pain are on going.   OBJECTIVE IMPAIRMENTS: decreased activity tolerance, decreased ROM, increased muscle spasms, postural dysfunction, and pain.   ACTIVITY LIMITATIONS: sitting  PERSONAL FACTORS: Time since onset of injury/illness/exacerbation are also affecting patient's functional outcome.   REHAB POTENTIAL: Good  CLINICAL DECISION MAKING: Evolving/moderate complexity  EVALUATION COMPLEXITY: Low   GOALS:  LONG TERM GOALS: Target date: 08/01/23  Ind with an HEP.  Goal status: Partially met  2.  Sitting 30 minutes with pain not > 3/10.  Goal status:On going  3.  Perform ADL's with pain not > 3/10.  Goal status: On going  PLAN:  PT FREQUENCY: 2x/week  PT DURATION: 6 weeks  PLANNED INTERVENTIONS: Therapeutic exercises, Therapeutic activity, Patient/Family education, Self Care, Dry Needling, Electrical stimulation, Cryotherapy, Moist heat, Ultrasound, and Manual therapy.  PLAN FOR NEXT SESSION: TPR to QL.  Chin tucks and extension, core exercise progression mindful of spondylolistesis, spinal protection techniques, body mechanics training.  Modalities and STW/M as needed.     Yasir Kitner,CHRIS, PTA 07/15/2023, 1:01 PM

## 2023-07-17 ENCOUNTER — Ambulatory Visit: Admitting: *Deleted

## 2023-07-17 DIAGNOSIS — M5459 Other low back pain: Secondary | ICD-10-CM

## 2023-07-17 DIAGNOSIS — M542 Cervicalgia: Secondary | ICD-10-CM | POA: Diagnosis not present

## 2023-07-17 DIAGNOSIS — M62838 Other muscle spasm: Secondary | ICD-10-CM

## 2023-07-17 NOTE — Therapy (Signed)
OUTPATIENT PHYSICAL THERAPY SPINE TREATMENT   Patient Name: Shelby Skinner MRN: [id number] DOB:1961-01-14, 62 y.o., female Today's Date: 07/17/2023  END OF SESSION:  PT End of Session - 07/17/23 0932     Visit Number 7    Number of Visits 12    Date for PT Re-Evaluation 08/01/23    PT Start Time 0930    PT Stop Time 1020    PT Time Calculation (min) 50 min             No past medical history on file. Past Surgical History:  Procedure Laterality Date   APPENDECTOMY  2010   BREAST CYST ASPIRATION     Patient Active Problem List   Diagnosis Date Noted   Vitamin D deficiency 06/10/2023   Neck pain, chronic 06/10/2023   Spondylosis 02/23/2019   REFERRING PROVIDER: Gilford Silvius  REFERRING DIAG: Spondylosis.  Rationale for Evaluation and Treatment: Rehabilitation  THERAPY DIAG:  Cervicalgia  Other low back pain  Other muscle spasm  ONSET DATE: "Years ago"  SUBJECTIVE:                                                                                                                                                                                           SUBJECTIVE STATEMENT: Did good after last Rx. Tried side planks, but aggravated my neck    PERTINENT HISTORY:  H/o spinal pain.    PAIN:  Are you having pain? Mild today.  Ache, "tension".  PRECAUTIONS: Other: Spondylolisthesis.  WEIGHT BEARING RESTRICTIONS: No  FALLS:  Has patient fallen in last 6 months? No  LIVING ENVIRONMENT: Lives with: lives with their spouse Lives in: House/apartment Has following equipment at home: None  OCCUPATION: Retired.  PLOF: Independent  PATIENT GOALS: Decrease tension, do more with less pain.  OBJECTIVE:   DIAGNOSTIC FINDINGS:   04/12/19:  IMPRESSION: Abnormal MRI scan cervical spine showing abnormal curvature as well as prominent disc degenerative changes from C3-C6 but without significant compression.  No abnormal enhancing lesions are noted.   06/14/23:   IMPRESSION: 1. Moderate facet hypertrophy at L3-L4, L4-L5 and L5-S1. 2. Grade 1 anterolisthesis of L4 on L5 and L5 on S1.  06/14/23:  IMPRESSION: 1. Degenerative disc disease from C4-C5 through C6-C7. 2. Moderate C7-T1 facet hypertrophy. Lesser facet hypertrophy at C4-C5. 3. C4-C5 bony neural foraminal narrowing on the right. 4. Mild scoliosis. Trace retrolisthesis of C6 on C7 and anterolisthesis of C7 on T1.    PATIENT SURVEYS:  FOTO .   POSTURE: rounded shoulders, forward head, and decreased lordosis.  PALPATION: "Tension" reported over cervical paraspinal musculature.  Increased tone over bilateral UT's  and [employer].  Pain c/o in L5-S1 and bilateral SIJ regions radiating into both hips.  LUMBAR/CERVICAL ROM:  Full active flexion.  Active right cervical rotation is 68 degrees and left is 65 degrees.      LOWER EXTREMITY MMT:   Patient able to provide normal strength grade via MMTing for major muscle groups of bilateral U and LE's.  LUMBAR SPECIAL TESTS:  Equal leg lengths. (-) SLR testing. (-) FABER  and Sacral Press testing.   Absent right Achilles reflex.  GAIT: WNL. TREATMENT Date:                                                                  07-17-23 ASSESSMENT:                                    EXERCISE LOG  Exercise Repetitions and Resistance Comments  Dying Bug    Bridge    Sidelying SLR    Cat/ camel    Standing modified       Bird-dog  Decreased hold time and cues to not extend LE as much  Squats    Tband Rows    Tband Shldr Ext     Blank cell = exercise not performed today  Reviewed and discussed all HEP as well as Movement patterns with ADL's with AB bracing  Pt tried side plank at home, but may have aggravated her neck. DC side bridge Modalities:  Combo at 1.5 w/cm2 x 14 mins  Manual: STW/ TPR Bil. cerv. Paras with Pt seated.       CLINICAL IMPRESSION: Pt arrived today reporting doing good after last Rx and reports doing better overall  with ADL's when she AB braces. She reports that she can tell she feels instability in her back when she doesn't brace. She performed Her HEP at home, but the next morning her neck was flared up and tight, but unsure why. We discussed HEP and decided to hold on side planks at this time.[place] combo and  STW also performed to Bil. UT's and  Cervical paras with good release. LTG's for pain are on going.   OBJECTIVE IMPAIRMENTS: decreased activity tolerance, decreased ROM, increased muscle spasms, postural dysfunction, and pain.   ACTIVITY LIMITATIONS: sitting  PERSONAL FACTORS: Time since onset of injury/illness/exacerbation are also affecting patient's functional outcome.   REHAB POTENTIAL: Good  CLINICAL DECISION MAKING: Evolving/moderate complexity  EVALUATION COMPLEXITY: Low   GOALS:  LONG TERM GOALS: Target date: 08/01/23  Ind with an HEP.  Goal status: Partially met  2.  Sitting 30 minutes with pain not > 3/10.  Goal status:On going  3.  Perform ADL's with pain not > 3/10.  Goal status: On going  PLAN:  PT FREQUENCY: 2x/week  PT DURATION: 6 weeks  PLANNED INTERVENTIONS: Therapeutic exercises, Therapeutic activity, Patient/Family education, Self Care, Dry Needling, Electrical stimulation, Cryotherapy, Moist heat, Ultrasound, and Manual therapy.  PLAN FOR NEXT SESSION: TPR to QL.  Chin tucks and extension, core exercise progression mindful of spondylolistesis, spinal protection techniques, body mechanics training.  Modalities and STW/M as needed.     Ola Raap,CHRIS, PTA 07/17/2023, 11:54 AM

## 2023-07-22 ENCOUNTER — Ambulatory Visit: Admitting: *Deleted

## 2023-07-22 DIAGNOSIS — M62838 Other muscle spasm: Secondary | ICD-10-CM

## 2023-07-22 DIAGNOSIS — M542 Cervicalgia: Secondary | ICD-10-CM | POA: Diagnosis not present

## 2023-07-22 DIAGNOSIS — M5459 Other low back pain: Secondary | ICD-10-CM

## 2023-07-22 NOTE — Therapy (Signed)
OUTPATIENT PHYSICAL THERAPY SPINE TREATMENT   Patient Name: Shelby Skinner MRN: [id number] DOB:07/24/1961, 62 y.o., female Today's Date: 07/22/2023  END OF SESSION:  PT End of Session - 07/22/23 0934     Visit Number 8    Number of Visits 12    Date for PT Re-Evaluation 08/01/23    PT Start Time 0932    PT Stop Time 1024    PT Time Calculation (min) 52 min             No past medical history on file. Past Surgical History:  Procedure Laterality Date   APPENDECTOMY  2010   BREAST CYST ASPIRATION     Patient Active Problem List   Diagnosis Date Noted   Vitamin D deficiency 06/10/2023   Neck pain, chronic 06/10/2023   Spondylosis 02/23/2019   REFERRING PROVIDER: Gilford Silvius  REFERRING DIAG: Spondylosis.  Rationale for Evaluation and Treatment: Rehabilitation  THERAPY DIAG:  Cervicalgia  Other low back pain  Other muscle spasm  ONSET DATE: "Years ago"  SUBJECTIVE:                                                                                                                                                                                           SUBJECTIVE STATEMENT: Did good after last Rx. Flared up things at home helping with a retaining wall.    PERTINENT HISTORY:  H/o spinal pain.    PAIN:  Are you having pain? Mild today.  Ache, "tension".  PRECAUTIONS: Other: Spondylolisthesis.  WEIGHT BEARING RESTRICTIONS: No  FALLS:  Has patient fallen in last 6 months? No  LIVING ENVIRONMENT: Lives with: lives with their spouse Lives in: House/apartment Has following equipment at home: None  OCCUPATION: Retired.  PLOF: Independent  PATIENT GOALS: Decrease tension, do more with less pain.  OBJECTIVE:   DIAGNOSTIC FINDINGS:   04/12/19:  IMPRESSION: Abnormal MRI scan cervical spine showing abnormal curvature as well as prominent disc degenerative changes from C3-C6 but without significant compression.  No abnormal enhancing lesions are noted.    06/14/23:  IMPRESSION: 1. Moderate facet hypertrophy at L3-L4, L4-L5 and L5-S1. 2. Grade 1 anterolisthesis of L4 on L5 and L5 on S1.  06/14/23:  IMPRESSION: 1. Degenerative disc disease from C4-C5 through C6-C7. 2. Moderate C7-T1 facet hypertrophy. Lesser facet hypertrophy at C4-C5. 3. C4-C5 bony neural foraminal narrowing on the right. 4. Mild scoliosis. Trace retrolisthesis of C6 on C7 and anterolisthesis of C7 on T1.    PATIENT SURVEYS:  FOTO .   POSTURE: rounded shoulders, forward head, and decreased lordosis.  PALPATION: "Tension" reported over cervical paraspinal musculature.  Increased tone  over bilateral UT's and Felicity Coyer Scap's.  Pain c/o in L5-S1 and bilateral SIJ regions radiating into both hips.  LUMBAR/CERVICAL ROM:  Full active flexion.  Active right cervical rotation is 68 degrees and left is 65 degrees.      LOWER EXTREMITY MMT:   Patient able to provide normal strength grade via MMTing for major muscle groups of bilateral U and LE's.  LUMBAR SPECIAL TESTS:  Equal leg lengths. (-) SLR testing. (-) FABER  and Sacral Press testing.   Absent right Achilles reflex.  GAIT: WNL. TREATMENT Date:                                                                  07-22-23 ASSESSMENT:                                    EXERCISE LOG  Exercise Repetitions and Resistance Comments  Dying Bug    Bridge    Sidelying SLR    Cat/ camel    Standing modified       Bird-dog  Decreased hold time and cues to not extend LE as much  Squats    Tband Rows    Tband Shldr Ext     Blank cell = exercise not performed today  Reviewed and discussed all HEP as well as Movement patterns with ADL's with AB bracing   Modalities:  Combo at 1.5 w/cm2 x 14 mins to LB paras with Pt prone Manual: STW  to Bil. LB paras with Pt prone. Biofreeze also applied  15       CLINICAL IMPRESSION: Pt arrived today reporting that she was doing good after last Rx, but flared up her LB helping with a  retaining wall.We discussed and reviewed possible pain triggers with what she was doing and different movement patterns to help prevent pain triggers. [place] combo was performed to Bil. LB paras f/b  STW to both as well and biofreeze applied.     OBJECTIVE IMPAIRMENTS: decreased activity tolerance, decreased ROM, increased muscle spasms, postural dysfunction, and pain.   ACTIVITY LIMITATIONS: sitting  PERSONAL FACTORS: Time since onset of injury/illness/exacerbation are also affecting patient's functional outcome.   REHAB POTENTIAL: Good  CLINICAL DECISION MAKING: Evolving/moderate complexity  EVALUATION COMPLEXITY: Low   GOALS:  LONG TERM GOALS: Target date: 08/01/23  Ind with an HEP.  Goal status: Partially met  2.  Sitting 30 minutes with pain not > 3/10.  Goal status:On going  3.  Perform ADL's with pain not > 3/10.  Goal status: On going  PLAN:  PT FREQUENCY: 2x/week  PT DURATION: 6 weeks  PLANNED INTERVENTIONS: Therapeutic exercises, Therapeutic activity, Patient/Family education, Self Care, Dry Needling, Electrical stimulation, Cryotherapy, Moist heat, Ultrasound, and Manual therapy.  PLAN FOR NEXT SESSION: TPR to QL.  Chin tucks and extension, core exercise progression mindful of spondylolistesis, spinal protection techniques, body mechanics training.  Modalities and STW/M as needed.     Laroy Mustard,CHRIS, PTA 07/22/2023, 10:44 AM

## 2023-07-25 ENCOUNTER — Ambulatory Visit: Attending: Family Medicine | Admitting: *Deleted

## 2023-07-25 DIAGNOSIS — M5459 Other low back pain: Secondary | ICD-10-CM | POA: Insufficient documentation

## 2023-07-25 DIAGNOSIS — M542 Cervicalgia: Secondary | ICD-10-CM | POA: Insufficient documentation

## 2023-07-25 DIAGNOSIS — M62838 Other muscle spasm: Secondary | ICD-10-CM | POA: Insufficient documentation

## 2023-07-25 NOTE — Therapy (Signed)
OUTPATIENT PHYSICAL THERAPY SPINE TREATMENT   Patient Name: Shelby Skinner MRN: [id number] DOB:Mar 13, 1961, 62 y.o., female Today's Date: 07/25/2023  END OF SESSION:  PT End of Session - 07/25/23 0934     Visit Number 9    Number of Visits 12    Date for PT Re-Evaluation 08/01/23    PT Start Time 0930    PT Stop Time 1020    PT Time Calculation (min) 50 min             No past medical history on file. Past Surgical History:  Procedure Laterality Date   APPENDECTOMY  2010   BREAST CYST ASPIRATION     Patient Active Problem List   Diagnosis Date Noted   Vitamin D deficiency 06/10/2023   Neck pain, chronic 06/10/2023   Spondylosis 02/23/2019   REFERRING PROVIDER: Gilford Silvius  REFERRING DIAG: Spondylosis.  Rationale for Evaluation and Treatment: Rehabilitation  THERAPY DIAG:  Cervicalgia  Other low back pain  Other muscle spasm  ONSET DATE: "Years ago"  SUBJECTIVE:                                                                                                                                                                                           SUBJECTIVE STATEMENT: Did good after last Rx. Everything finally calmed back down. Doing better with finding my neutral pelvic position and it helps decrease mm tension in my neck and back. My hip flexors still feel tight.    PERTINENT HISTORY:  H/o spinal pain.    PAIN:  Are you having pain? Mild today.  Ache, "tension".  PRECAUTIONS: Other: Spondylolisthesis.  WEIGHT BEARING RESTRICTIONS: No  FALLS:  Has patient fallen in last 6 months? No  LIVING ENVIRONMENT: Lives with: lives with their spouse Lives in: House/apartment Has following equipment at home: None  OCCUPATION: Retired.  PLOF: Independent  PATIENT GOALS: Decrease tension, do more with less pain.  OBJECTIVE:   DIAGNOSTIC FINDINGS:   04/12/19:  IMPRESSION: Abnormal MRI scan cervical spine showing abnormal curvature as well as  prominent disc degenerative changes from C3-C6 but without significant compression.  No abnormal enhancing lesions are noted.   06/14/23:  IMPRESSION: 1. Moderate facet hypertrophy at L3-L4, L4-L5 and L5-S1. 2. Grade 1 anterolisthesis of L4 on L5 and L5 on S1.  06/14/23:  IMPRESSION: 1. Degenerative disc disease from C4-C5 through C6-C7. 2. Moderate C7-T1 facet hypertrophy. Lesser facet hypertrophy at C4-C5. 3. C4-C5 bony neural foraminal narrowing on the right. 4. Mild scoliosis. Trace retrolisthesis of C6 on C7 and anterolisthesis of C7 on T1.    PATIENT SURVEYS:  FOTO .  POSTURE: rounded shoulders, forward head, and decreased lordosis.  PALPATION: "Tension" reported over cervical paraspinal musculature.  Increased tone over bilateral UT's and [employer].  Pain c/o in L5-S1 and bilateral SIJ regions radiating into both hips.  LUMBAR/CERVICAL ROM:  Full active flexion.  Active right cervical rotation is 68 degrees and left is 65 degrees.      LOWER EXTREMITY MMT:   Patient able to provide normal strength grade via MMTing for major muscle groups of bilateral U and LE's.  LUMBAR SPECIAL TESTS:  Equal leg lengths. (-) SLR testing. (-) FABER  and Sacral Press testing.   Absent right Achilles reflex.  GAIT: WNL.   TREATMENT            Date: 07-25-23 Reviewed and discussed all HEP as well as Movement patterns with ADL's with AB bracing and neutral pelvis. Added glute activation/isometrics with gentle standing hip flexion stretching. Manual STW and TPR to Bil. Hip flexors with Pt hook lying f/b gentle stretching with leg of the mat.                                                                      07-22-23                                    EXERCISE LOG  Exercise Repetitions and Resistance Comments  Dying Bug    Bridge    Sidelying SLR    Cat/ camel    Standing modified       Bird-dog  Decreased hold time and cues to not extend LE as much  Squats    Tband Rows     Tband Shldr Ext     Blank cell = exercise not performed today  Reviewed and discussed all HEP as well as Movement patterns with ADL's with AB bracing   Modalities:  Combo at 1.5 w/cm2 x 14 mins to LB paras with Pt prone Manual: STW  to Bil. LB paras with Pt prone. Biofreeze also applied  15       CLINICAL IMPRESSION: Pt arrived today reporting that she was doing good after last Rx, and that things finally calmed back down. Pt reports that she has been doing better with core activation and finding her neutral pelvic position to decrease tension in her LB mm as well as cervical. She also reports that both hip flexors are feeling tiht and sore. We reviewed and discussed HEP and added standing hip flexor stretch with glute activation as well as hooklying and allowing LE to hang off the bed for mild stretching. Manual STW and TPR was performed to Bil. Hip flexor musculature with good releases both sides. Decreased pain and tightness end of session.  OBJECTIVE IMPAIRMENTS: decreased activity tolerance, decreased ROM, increased muscle spasms, postural dysfunction, and pain.   ACTIVITY LIMITATIONS: sitting  PERSONAL FACTORS: Time since onset of injury/illness/exacerbation are also affecting patient's functional outcome.   REHAB POTENTIAL: Good  CLINICAL DECISION MAKING: Evolving/moderate complexity  EVALUATION COMPLEXITY: Low   GOALS:  LONG TERM GOALS: Target date: 08/01/23  Ind with an HEP.  Goal status: Partially met  2.  Sitting 30 minutes with pain not >  3/10.  Goal status:On going  3.  Perform ADL's with pain not > 3/10.  Goal status: On going  PLAN:  PT FREQUENCY: 2x/week  PT DURATION: 6 weeks  PLANNED INTERVENTIONS: Therapeutic exercises, Therapeutic activity, Patient/Family education, Self Care, Dry Needling, Electrical stimulation, Cryotherapy, Moist heat, Ultrasound, and Manual therapy.  PLAN FOR NEXT SESSION: TPR to QL.  Chin tucks and extension, core exercise  progression mindful of spondylolistesis, spinal protection techniques, body mechanics training.  Modalities and STW/M as needed.     Keanon Bevins,CHRIS, PTA 07/25/2023, 11:27 AM

## 2023-08-01 ENCOUNTER — Encounter: Admitting: *Deleted

## 2023-08-05 ENCOUNTER — Encounter: Admitting: *Deleted

## 2023-08-08 ENCOUNTER — Ambulatory Visit: Admitting: *Deleted

## 2023-08-08 DIAGNOSIS — M62838 Other muscle spasm: Secondary | ICD-10-CM

## 2023-08-08 DIAGNOSIS — M542 Cervicalgia: Secondary | ICD-10-CM

## 2023-08-08 DIAGNOSIS — M5459 Other low back pain: Secondary | ICD-10-CM

## 2023-08-08 NOTE — Therapy (Signed)
OUTPATIENT PHYSICAL THERAPY SPINE TREATMENT   Patient Name: Shelby Skinner MRN: [id number] DOB:09-Dec-1961, 62 y.o., female Today's Date: 08/08/2023  END OF SESSION:  PT End of Session - 08/08/23 0939     Visit Number 10    Number of Visits 12    Date for PT Re-Evaluation 08/01/23    PT Start Time 0940    PT Stop Time 1029    PT Time Calculation (min) 49 min             No past medical history on file. Past Surgical History:  Procedure Laterality Date   APPENDECTOMY  2010   BREAST CYST ASPIRATION     Patient Active Problem List   Diagnosis Date Noted   Vitamin D deficiency 06/10/2023   Neck pain, chronic 06/10/2023   Spondylosis 02/23/2019   REFERRING PROVIDER: Gilford Silvius  REFERRING DIAG: Spondylosis.  Rationale for Evaluation and Treatment: Rehabilitation  THERAPY DIAG:  Cervicalgia  Other low back pain  Other muscle spasm  ONSET DATE: "Years ago"  SUBJECTIVE:                                                                                                                                                                                           SUBJECTIVE STATEMENT:  I was sick last week with a bad cough and everything is flared back up and tight    PERTINENT HISTORY:  H/o spinal pain.    PAIN:  Are you having pain? Mild today.  Ache, "tension".  PRECAUTIONS: Other: Spondylolisthesis.  WEIGHT BEARING RESTRICTIONS: No  FALLS:  Has patient fallen in last 6 months? No  LIVING ENVIRONMENT: Lives with: lives with their spouse Lives in: House/apartment Has following equipment at home: None  OCCUPATION: Retired.  PLOF: Independent  PATIENT GOALS: Decrease tension, do more with less pain.  OBJECTIVE:   DIAGNOSTIC FINDINGS:   04/12/19:  IMPRESSION: Abnormal MRI scan cervical spine showing abnormal curvature as well as prominent disc degenerative changes from C3-C6 but without significant compression.  No abnormal enhancing lesions are noted.    06/14/23:  IMPRESSION: 1. Moderate facet hypertrophy at L3-L4, L4-L5 and L5-S1. 2. Grade 1 anterolisthesis of L4 on L5 and L5 on S1.  06/14/23:  IMPRESSION: 1. Degenerative disc disease from C4-C5 through C6-C7. 2. Moderate C7-T1 facet hypertrophy. Lesser facet hypertrophy at C4-C5. 3. C4-C5 bony neural foraminal narrowing on the right. 4. Mild scoliosis. Trace retrolisthesis of C6 on C7 and anterolisthesis of C7 on T1.    PATIENT SURVEYS:  FOTO .   POSTURE: rounded shoulders, forward head, and decreased lordosis.  PALPATION: "Tension" reported over cervical paraspinal musculature.  Increased tone over bilateral UT's and [employer].  Pain c/o in L5-S1 and bilateral SIJ regions radiating into both hips.  LUMBAR/CERVICAL ROM:  Full active flexion.  Active right cervical rotation is 68 degrees and left is 65 degrees.      LOWER EXTREMITY MMT:   Patient able to provide normal strength grade via MMTing for major muscle groups of bilateral U and LE's.  LUMBAR SPECIAL TESTS:  Equal leg lengths. (-) SLR testing. (-) FABER  and Sacral Press testing.   Absent right Achilles reflex.  GAIT: WNL.   TREATMENT            Date: 08-08-23 Reviewed and discussed all HEP as well as instructed in positional release with TP release for UT's using Thera-cane  Manual STW and TPR to Bil. Hip flexors with Pt hook lying f/b gentle stretching  [place] estim combo x 14 mins at 1.5 w/cm2  to Bil. Ut's and cerv. Paras.                                                                                                         EXERCISE LOG  Exercise Repetitions and Resistance Comments  Dying Bug    Bridge    Sidelying SLR    Cat/ camel    Standing modified       Bird-dog  Decreased hold time and cues to not extend LE as much  Squats    Tband Rows    Tband Shldr Ext     Blank cell = exercise not performed today  Reviewed and discussed all HEP as well as Movement patterns with ADL's with AB bracing    Modalities:  Combo at 1.5 w/cm2 x 14 mins to LB paras with Pt prone Manual: STW  to Bil. LB paras with Pt prone. Biofreeze also applied  15       CLINICAL IMPRESSION: Pt arrived today reporting that she was doing good after last Rx, and that things finally calmed back down. Pt reports that she has been doing better with core activation and finding her neutral pelvic position to decrease tension in her LB mm as well as cervical. She also reports that both hip flexors are feeling tiht and sore. We reviewed and discussed HEP and added standing hip flexor stretch with glute activation as well as hooklying and allowing LE to hang off the bed for mild stretching. Manual STW and TPR was performed to Bil. Hip flexor musculature with good releases both sides. Decreased pain and tightness end of session.  OBJECTIVE IMPAIRMENTS: decreased activity tolerance, decreased ROM, increased muscle spasms, postural dysfunction, and pain.   ACTIVITY LIMITATIONS: sitting  PERSONAL FACTORS: Time since onset of injury/illness/exacerbation are also affecting patient's functional outcome.   REHAB POTENTIAL: Good  CLINICAL DECISION MAKING: Evolving/moderate complexity  EVALUATION COMPLEXITY: Low   GOALS:  LONG TERM GOALS: Target date: 08/01/23  Ind with an HEP.  Goal status: Partially met  2.  Sitting 30 minutes with pain not > 3/10.  Goal status:On going  3.  Perform ADL's with pain  not > 3/10.  Goal status: On going  PLAN:  PT FREQUENCY: 2x/week  PT DURATION: 6 weeks  PLANNED INTERVENTIONS: Therapeutic exercises, Therapeutic activity, Patient/Family education, Self Care, Dry Needling, Electrical stimulation, Cryotherapy, Moist heat, Ultrasound, and Manual therapy.  PLAN FOR NEXT SESSION: TPR to QL.  Chin tucks and extension, core exercise progression mindful of spondylolistesis, spinal protection techniques, body mechanics training.  Modalities and STW/M as needed.     Calah Gershman,CHRIS,  PTA 08/08/2023, 11:40 AM

## 2023-08-12 ENCOUNTER — Ambulatory Visit: Admitting: *Deleted

## 2023-08-12 DIAGNOSIS — M5459 Other low back pain: Secondary | ICD-10-CM

## 2023-08-12 DIAGNOSIS — M542 Cervicalgia: Secondary | ICD-10-CM | POA: Diagnosis not present

## 2023-08-12 DIAGNOSIS — M62838 Other muscle spasm: Secondary | ICD-10-CM

## 2023-08-12 NOTE — Therapy (Signed)
OUTPATIENT PHYSICAL THERAPY SPINE TREATMENT   Patient Name: Shelby Skinner MRN: [id number] DOB:May 30, 1961, 62 y.o., female Today's Date: 08/12/2023  END OF SESSION:  PT End of Session - 08/12/23 0932     Visit Number 11    Number of Visits 12    Date for PT Re-Evaluation 08/01/23    PT Start Time 0932    PT Stop Time 1020    PT Time Calculation (min) 48 min             No past medical history on file. Past Surgical History:  Procedure Laterality Date   APPENDECTOMY  2010   BREAST CYST ASPIRATION     Patient Active Problem List   Diagnosis Date Noted   Vitamin D deficiency 06/10/2023   Neck pain, chronic 06/10/2023   Spondylosis 02/23/2019   REFERRING PROVIDER: Gilford Silvius  REFERRING DIAG: Spondylosis.  Rationale for Evaluation and Treatment: Rehabilitation  THERAPY DIAG:  Cervicalgia  Other low back pain  Other muscle spasm  ONSET DATE: "Years ago"  SUBJECTIVE:                                                                                                                                                                                           SUBJECTIVE STATEMENT:  Did good after last Rx. Today across my glutes is tight and neck is better. SIJ?    PERTINENT HISTORY:  H/o spinal pain.    PAIN:  Are you having pain? Mild today.  Ache, "tension".  PRECAUTIONS: Other: Spondylolisthesis.  WEIGHT BEARING RESTRICTIONS: No  FALLS:  Has patient fallen in last 6 months? No  LIVING ENVIRONMENT: Lives with: lives with their spouse Lives in: House/apartment Has following equipment at home: None  OCCUPATION: Retired.  PLOF: Independent  PATIENT GOALS: Decrease tension, do more with less pain.  OBJECTIVE:   DIAGNOSTIC FINDINGS:   04/12/19:  IMPRESSION: Abnormal MRI scan cervical spine showing abnormal curvature as well as prominent disc degenerative changes from C3-C6 but without significant compression.  No abnormal enhancing lesions are noted.    06/14/23:  IMPRESSION: 1. Moderate facet hypertrophy at L3-L4, L4-L5 and L5-S1. 2. Grade 1 anterolisthesis of L4 on L5 and L5 on S1.  06/14/23:  IMPRESSION: 1. Degenerative disc disease from C4-C5 through C6-C7. 2. Moderate C7-T1 facet hypertrophy. Lesser facet hypertrophy at C4-C5. 3. C4-C5 bony neural foraminal narrowing on the right. 4. Mild scoliosis. Trace retrolisthesis of C6 on C7 and anterolisthesis of C7 on T1.    PATIENT SURVEYS:  FOTO .   POSTURE: rounded shoulders, forward head, and decreased lordosis.  PALPATION: "Tension" reported over cervical paraspinal musculature.  Increased tone over bilateral UT's and [employer].  Pain c/o in L5-S1 and bilateral SIJ regions radiating into both hips.  LUMBAR/CERVICAL ROM:  Full active flexion.  Active right cervical rotation is 68 degrees and left is 65 degrees.      LOWER EXTREMITY MMT:   Patient able to provide normal strength grade via MMTing for major muscle groups of bilateral U and LE's.  LUMBAR SPECIAL TESTS:  Equal leg lengths. (-) SLR testing. (-) FABER  and Sacral Press testing.   Absent right Achilles reflex.  GAIT: WNL.   TREATMENT            Date: 08-12-23 Reviewed and discussed all HEP as well as instructed in modified bird-dog while on all 4's with each leg raise x 6 with 10 secs and did well    [place] estim combo x 16 mins at 1.5 w/cm2  to Bil. Ut's and cerv. Paras.                                                                                                        EXERCISE LOG  Exercise Repetitions and Resistance Comments  Dying Bug    Bridge    Sidelying SLR    Cat/ camel    Standing modified       Bird-dog  Decreased hold time and cues to not extend LE as much  Squats    Tband Rows    Tband Shldr Ext     Blank cell = exercise not performed today  Reviewed and discussed all HEP as well as Movement patterns with ADL's with AB bracing   Modalities:  Combo at 1.5 w/cm2 x 14 mins to LB paras  with Pt prone Manual: STW  to Bil. LB paras with Pt prone. Biofreeze also applied  15       CLINICAL IMPRESSION: Pt arrived today reporting that she was did okay after last Rx, but  having increased tightness across her glutes and LB as well as some UT tightness. We reviewed HEP and put standing bird-dog on hold and added QP modified bird-dog with leg raise to HEP. [place] combo performed to UT's and tolerated well.     OBJECTIVE IMPAIRMENTS: decreased activity tolerance, decreased ROM, increased muscle spasms, postural dysfunction, and pain.   ACTIVITY LIMITATIONS: sitting  PERSONAL FACTORS: Time since onset of injury/illness/exacerbation are also affecting patient's functional outcome.   REHAB POTENTIAL: Good  CLINICAL DECISION MAKING: Evolving/moderate complexity  EVALUATION COMPLEXITY: Low   GOALS:  LONG TERM GOALS: Target date: 08/01/23  Ind with an HEP.  Goal status: Partially met  2.  Sitting 30 minutes with pain not > 3/10.  Goal status:On going  3.  Perform ADL's with pain not > 3/10.  Goal status: On going  PLAN:  PT FREQUENCY: 2x/week  PT DURATION: 6 weeks  PLANNED INTERVENTIONS: Therapeutic exercises, Therapeutic activity, Patient/Family education, Self Care, Dry Needling, Electrical stimulation, Cryotherapy, Moist heat, Ultrasound, and Manual therapy.  PLAN FOR NEXT SESSION: TPR to QL.  Chin tucks and extension, core exercise progression mindful of  spondylolistesis, spinal protection techniques, body mechanics training.  Modalities and STW/M as needed.     Aliyana Dlugosz,CHRIS, PTA 08/12/2023, 6:18 PM

## 2023-08-15 ENCOUNTER — Ambulatory Visit: Admitting: *Deleted

## 2023-08-15 DIAGNOSIS — M5459 Other low back pain: Secondary | ICD-10-CM

## 2023-08-15 DIAGNOSIS — M542 Cervicalgia: Secondary | ICD-10-CM

## 2023-08-15 DIAGNOSIS — M62838 Other muscle spasm: Secondary | ICD-10-CM

## 2023-08-15 NOTE — Addendum Note (Signed)
Addended by: Carry Weesner, [place] W on: 08/15/2023 01:47 PM   Modules accepted: Orders

## 2023-08-15 NOTE — Therapy (Addendum)
OUTPATIENT PHYSICAL THERAPY SPINE TREATMENT   Patient Name: Shelby Skinner MRN: [id number] DOB:[birth year], 62 y.o., female Today's Date: 08/15/2023  END OF SESSION:  PT End of Session - 08/15/23 0932     Visit Number 12    Number of Visits 18    Date for PT Re-Evaluation 09/05/23    PT Start Time 0933    PT Stop Time 1023    PT Time Calculation (min) 50 min    Activity Tolerance Patient tolerated treatment well    Behavior During Therapy [facility] for tasks assessed/performed             No past medical history on file. Past Surgical History:  Procedure Laterality Date   APPENDECTOMY  2010   BREAST CYST ASPIRATION     Patient Active Problem List   Diagnosis Date Noted   Vitamin D deficiency 06/10/2023   Neck pain, chronic 06/10/2023   Spondylosis 02/23/2019   REFERRING PROVIDER: Gilford Silvius  REFERRING DIAG: Spondylosis.  Rationale for Evaluation and Treatment: Rehabilitation  THERAPY DIAG:  Cervicalgia  Other low back pain  Other muscle spasm  ONSET DATE: "Years ago"  SUBJECTIVE:                                                                                                                                                                                           SUBJECTIVE STATEMENT:  Did okay  after last Rx, but did have some symptoms in LT foot later that day. Neck and glutes are tight today. Try some new exs? 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    PAIN:  Are you having pain? Mild today.  Ache, "tension".  PRECAUTIONS: Other: Spondylolisthesis.  WEIGHT BEARING RESTRICTIONS: No  FALLS:  Has patient fallen in last 6 months? No  LIVING ENVIRONMENT: Lives with: lives with their spouse Lives in: House/apartment Has following equipment at home: None  OCCUPATION: Retired.  PLOF: Independent  PATIENT GOALS: Decrease tension, do more with less pain.  OBJECTIVE:   DIAGNOSTIC FINDINGS:   04/12/19:   IMPRESSION: Abnormal MRI scan cervical spine showing abnormal curvature as well as prominent disc degenerative changes from C3-C6 but without significant compression.  No abnormal enhancing lesions are noted.   06/14/23:  IMPRESSION: 1. Moderate facet hypertrophy at L3-L4, L4-L5 and L5-S1. 2. Grade 1 anterolisthesis of L4 on L5 and L5 on S1.  06/14/23:  IMPRESSION: 1. Degenerative disc disease from C4-C5 through C6-C7. 2. Moderate C7-T1 facet hypertrophy. Lesser facet hypertrophy at C4-C5. 3. C4-C5 bony neural foraminal narrowing on the right. 4. Mild scoliosis. Trace retrolisthesis of  C6 on C7 and anterolisthesis of C7 on T1.    PATIENT SURVEYS:  FOTO .   POSTURE: rounded shoulders, forward head, and decreased lordosis.  PALPATION: "Tension" reported over cervical paraspinal musculature.  Increased tone over bilateral UT's and [employer].  Pain c/o in L5-S1 and bilateral SIJ regions radiating into both hips.  LUMBAR/CERVICAL ROM:  Full active flexion.  Active right cervical rotation is 68 degrees and left is 65 degrees.      LOWER EXTREMITY MMT:   Patient able to provide normal strength grade via MMTing for major muscle groups of bilateral U and LE's.  LUMBAR SPECIAL TESTS:  Equal leg lengths. (-) SLR testing. (-) FABER  and Sacral Press testing.   Absent right Achilles reflex.  GAIT: WNL.   TREATMENT            Date: 08-15-23 Reviewed and discussed all HEP. Put hold on Leg raise in QP. Added 10# KB around the [place] x 10 each way and [place] curl-up 2x6 hold 5-10 sec holds.     Manual STW to Cerv. Paras and Bil. UT's with Ue's propped on pillows                                                                                                        EXERCISE LOG  Exercise Repetitions and Resistance Comments  Dying Bug    Bridge    Sidelying SLR    Cat/ camel    Standing modified       Bird-dog  Decreased hold time and cues to not extend LE as much  Squats    Tband  Rows    Tband Shldr Ext     Blank cell = exercise not performed today         CLINICAL IMPRESSION: Pt arrived today reporting that she  did okay after last Rx, but  had some LT foot tingling. Bird-dog put on hold for now. We reviewed and discussed current HEP and movement patterns. Added KB around the [place] ex and [place] curl up to HEP. STW performed to Bil UT's and Cerv. Paras with good release. 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 She continues to have flare ups daily, but over all has progressed and would like to continue PT for 6 more visits.     OBJECTIVE IMPAIRMENTS: decreased activity tolerance, decreased ROM, increased muscle spasms, postural dysfunction, and pain.   ACTIVITY LIMITATIONS: sitting  PERSONAL FACTORS: Time since onset of injury/illness/exacerbation are also affecting patient's functional outcome.   REHAB POTENTIAL: Good  CLINICAL DECISION MAKING: Evolving/moderate complexity  EVALUATION COMPLEXITY: Low   GOALS:  LONG TERM GOALS: Target date: 08/01/23  Ind with an HEP.  Goal status: Partially met  2.  Sitting 30 minutes with pain not > 3/10.  Goal status:Partially met  3.  Perform ADL's with pain not > 3/10.  Goal status: On going  PLAN:  PT FREQUENCY: 2x/week  PT DURATION:  6 weeks  PLANNED INTERVENTIONS: Therapeutic exercises, Therapeutic activity, Patient/Family education, Self Care, Dry Needling, Electrical stimulation, Cryotherapy, Moist heat, Ultrasound, and Manual therapy.  PLAN FOR NEXT SESSION: TPR to QL.  Chin tucks and extension, core exercise progression mindful of spondylolistesis, spinal protection techniques, body mechanics training.  Modalities and STW/M as needed.  Recert   [place], [place], PT 08/15/2023, 1:46 PM

## 2023-08-19 ENCOUNTER — Ambulatory Visit: Admitting: *Deleted

## 2023-08-19 DIAGNOSIS — M542 Cervicalgia: Secondary | ICD-10-CM

## 2023-08-19 DIAGNOSIS — M5459 Other low back pain: Secondary | ICD-10-CM

## 2023-08-19 DIAGNOSIS — M62838 Other muscle spasm: Secondary | ICD-10-CM

## 2023-08-19 NOTE — Therapy (Signed)
OUTPATIENT PHYSICAL THERAPY SPINE TREATMENT   Patient Name: Shelby Skinner MRN: [id number] DOB:11-28-1961, 62 y.o., female Today's Date: 08/19/2023  END OF SESSION:  PT End of Session - 08/19/23 1105     Visit Number 13    Number of Visits 18    Date for PT Re-Evaluation 09/05/23    PT Start Time 1105    PT Stop Time 1155    PT Time Calculation (min) 50 min             No past medical history on file. Past Surgical History:  Procedure Laterality Date   APPENDECTOMY  2010   BREAST CYST ASPIRATION     Patient Active Problem List   Diagnosis Date Noted   Vitamin D deficiency 06/10/2023   Neck pain, chronic 06/10/2023   Spondylosis 02/23/2019   REFERRING PROVIDER: Gilford Silvius  REFERRING DIAG: Spondylosis.  Rationale for Evaluation and Treatment: Rehabilitation  THERAPY DIAG:  Cervicalgia  Other low back pain  Other muscle spasm  ONSET DATE: "Years ago"  SUBJECTIVE:                                                                                                                                                                                           SUBJECTIVE STATEMENT:  Did okay  after last Rx, and doing better today   PERTINENT HISTORY:  H/o spinal pain.    PAIN:  Are you having pain? Mild today.  Ache, "tension".  PRECAUTIONS: Other: Spondylolisthesis.  WEIGHT BEARING RESTRICTIONS: No  FALLS:  Has patient fallen in last 6 months? No  LIVING ENVIRONMENT: Lives with: lives with their spouse Lives in: House/apartment Has following equipment at home: None  OCCUPATION: Retired.  PLOF: Independent  PATIENT GOALS: Decrease tension, do more with less pain.  OBJECTIVE:   DIAGNOSTIC FINDINGS:   04/12/19:  IMPRESSION: Abnormal MRI scan cervical spine showing abnormal curvature as well as prominent disc degenerative changes from C3-C6 but without significant compression.  No abnormal enhancing lesions are noted.   06/14/23:  IMPRESSION: 1.  Moderate facet hypertrophy at L3-L4, L4-L5 and L5-S1. 2. Grade 1 anterolisthesis of L4 on L5 and L5 on S1.  06/14/23:  IMPRESSION: 1. Degenerative disc disease from C4-C5 through C6-C7. 2. Moderate C7-T1 facet hypertrophy. Lesser facet hypertrophy at C4-C5. 3. C4-C5 bony neural foraminal narrowing on the right. 4. Mild scoliosis. Trace retrolisthesis of C6 on C7 and anterolisthesis of C7 on T1.    PATIENT SURVEYS:  FOTO .   POSTURE: rounded shoulders, forward head, and decreased lordosis.  PALPATION: "Tension" reported over cervical paraspinal musculature.  Increased tone over bilateral UT's and Felicity Coyer  Scap's.  Pain c/o in L5-S1 and bilateral SIJ regions radiating into both hips.  LUMBAR/CERVICAL ROM:  Full active flexion.  Active right cervical rotation is 68 degrees and left is 65 degrees.      LOWER EXTREMITY MMT:   Patient able to provide normal strength grade via MMTing for major muscle groups of bilateral U and LE's.  LUMBAR SPECIAL TESTS:  Equal leg lengths. (-) SLR testing. (-) FABER  and Sacral Press testing.   Absent right Achilles reflex.  GAIT: WNL.   TREATMENT            Date: 08-19-23  Discussed cardio therex with walking and circuit training exs. Reviewed and discussed all HEP and Pt was able to perform modified Bird-dog again with leg raise. Anti-rotation press with yellow tband x 10 each side.Reviewed and discussed 10# KB around [place] x 10 , [place] curl-up 2x6 hold 5-10 sec holds.                                                                                                             EXERCISE LOG  Exercise Repetitions and Resistance Comments  Dying Bug    Bridge    Sidelying SLR    Cat/ camel    Standing modified       Bird-dog  Decreased hold time and cues to not extend LE as much  Squats    Tband Rows    Tband Shldr Ext     Blank cell = exercise not performed today         CLINICAL IMPRESSION: Pt arrived today reporting that she   did okay after last Rx, and doing good today. Rx focused again on HEP and what she has been able to do and not do. Pt was doing better today and was able to progress with some modifications to current exs and with added therex. Yellow tband and handout given for HEP. Work on cardio therex next Rx     OBJECTIVE IMPAIRMENTS: decreased activity tolerance, decreased ROM, increased muscle spasms, postural dysfunction, and pain.   ACTIVITY LIMITATIONS: sitting  PERSONAL FACTORS: Time since onset of injury/illness/exacerbation are also affecting patient's functional outcome.   REHAB POTENTIAL: Good  CLINICAL DECISION MAKING: Evolving/moderate complexity  EVALUATION COMPLEXITY: Low   GOALS:  LONG TERM GOALS: Target date: 08/01/23  Ind with an HEP.  Goal status: Partially met  2.  Sitting 30 minutes with pain not > 3/10.  Goal status:Partially met  3.  Perform ADL's with pain not > 3/10.  Goal status: On going  PLAN:  PT FREQUENCY: 2x/week  PT DURATION: 6 weeks  PLANNED INTERVENTIONS: Therapeutic exercises, Therapeutic activity, Patient/Family education, Self Care, Dry Needling, Electrical stimulation, Cryotherapy, Moist heat, Ultrasound, and Manual therapy.  PLAN FOR NEXT SESSION: TPR to QL.  Chin tucks and extension, core exercise progression mindful of spondylolistesis, spinal protection techniques, body mechanics training.  Modalities and STW/M as needed.  Recert   Mylia Pondexter,CHRIS, PTA 08/19/2023, 2:04 PM

## 2023-08-22 ENCOUNTER — Ambulatory Visit: Admitting: *Deleted

## 2023-08-22 DIAGNOSIS — M542 Cervicalgia: Secondary | ICD-10-CM

## 2023-08-22 DIAGNOSIS — M5459 Other low back pain: Secondary | ICD-10-CM

## 2023-08-22 DIAGNOSIS — M62838 Other muscle spasm: Secondary | ICD-10-CM

## 2023-08-22 NOTE — Therapy (Signed)
OUTPATIENT PHYSICAL THERAPY SPINE TREATMENT   Patient Name: Shelby Skinner MRN: [id number] DOB:Jun 26, 1961, 62 y.o., female Today's Date: 08/22/2023  END OF SESSION:  PT End of Session - 08/22/23 1021     Visit Number 14    Number of Visits 18    Date for PT Re-Evaluation 09/05/23    PT Start Time 1015    PT Stop Time 1105    PT Time Calculation (min) 50 min             No past medical history on file. Past Surgical History:  Procedure Laterality Date   APPENDECTOMY  2010   BREAST CYST ASPIRATION     Patient Active Problem List   Diagnosis Date Noted   Vitamin D deficiency 06/10/2023   Neck pain, chronic 06/10/2023   Spondylosis 02/23/2019   REFERRING PROVIDER: Gilford Silvius  REFERRING DIAG: Spondylosis.  Rationale for Evaluation and Treatment: Rehabilitation  THERAPY DIAG:  Cervicalgia  Other low back pain  Other muscle spasm  ONSET DATE: "Years ago"  SUBJECTIVE:                                                                                                                                                                                           SUBJECTIVE STATEMENT:  Did okay  after last Rx, and doing better today still. Tried KB ex and press   PERTINENT HISTORY:  H/o spinal pain.    PAIN:  Are you having pain? Mild today.  Ache, "tension".  PRECAUTIONS: Other: Spondylolisthesis.  WEIGHT BEARING RESTRICTIONS: No  FALLS:  Has patient fallen in last 6 months? No  LIVING ENVIRONMENT: Lives with: lives with their spouse Lives in: House/apartment Has following equipment at home: None  OCCUPATION: Retired.  PLOF: Independent  PATIENT GOALS: Decrease tension, do more with less pain.  OBJECTIVE:   DIAGNOSTIC FINDINGS:   04/12/19:  IMPRESSION: Abnormal MRI scan cervical spine showing abnormal curvature as well as prominent disc degenerative changes from C3-C6 but without significant compression.  No abnormal enhancing lesions are noted.    06/14/23:  IMPRESSION: 1. Moderate facet hypertrophy at L3-L4, L4-L5 and L5-S1. 2. Grade 1 anterolisthesis of L4 on L5 and L5 on S1.  06/14/23:  IMPRESSION: 1. Degenerative disc disease from C4-C5 through C6-C7. 2. Moderate C7-T1 facet hypertrophy. Lesser facet hypertrophy at C4-C5. 3. C4-C5 bony neural foraminal narrowing on the right. 4. Mild scoliosis. Trace retrolisthesis of C6 on C7 and anterolisthesis of C7 on T1.    PATIENT SURVEYS:  FOTO .   POSTURE: rounded shoulders, forward head, and decreased lordosis.  PALPATION: "Tension" reported over cervical paraspinal musculature.  Increased  tone over bilateral UT's and [employer].  Pain c/o in L5-S1 and bilateral SIJ regions radiating into both hips.  LUMBAR/CERVICAL ROM:  Full active flexion.  Active right cervical rotation is 68 degrees and left is 65 degrees.      LOWER EXTREMITY MMT:   Patient able to provide normal strength grade via MMTing for major muscle groups of bilateral U and LE's.  LUMBAR SPECIAL TESTS:  Equal leg lengths. (-) SLR testing. (-) FABER  and Sacral Press testing.   Absent right Achilles reflex.  GAIT: WNL.   TREATMENT            Date: 08-22-23  Discussed  and performed  8 cardio therex with walking and circuit training exs. Handouts given Reviewed and discussed all HEP and Pt was able to perform modified Bird-dog again with leg raise. Anti-rotation press with yellow tband x 10 each side Discussed 10# KB around the [place] x 10                                                                                                              EXERCISE LOG  Exercise Repetitions and Resistance Comments  Dying Bug    Bridge    Sidelying SLR    Cat/ camel    Standing modified       Bird-dog  Decreased hold time and cues to not extend LE as much  Squats    Tband Rows    Tband Shldr Ext     Blank cell = exercise not performed today         CLINICAL IMPRESSION: Pt arrived today reporting  that she  did okay after last Rx and was able to practice new exs at home. Today focused on circuit training for core and LE strengthening for HEP. Pt did well with exs with some modifications to engineer out pain triggers.Handout given for new HEP. Work on LT QL and hip strengthening next Rx     OBJECTIVE IMPAIRMENTS: decreased activity tolerance, decreased ROM, increased muscle spasms, postural dysfunction, and pain.   ACTIVITY LIMITATIONS: sitting  PERSONAL FACTORS: Time since onset of injury/illness/exacerbation are also affecting patient's functional outcome.   REHAB POTENTIAL: Good  CLINICAL DECISION MAKING: Evolving/moderate complexity  EVALUATION COMPLEXITY: Low   GOALS:  LONG TERM GOALS: Target date: 08/01/23  Ind with an HEP.  Goal status: Partially met  2.  Sitting 30 minutes with pain not > 3/10.  Goal status:Partially met  3.  Perform ADL's with pain not > 3/10.  Goal status: On going  PLAN:  PT FREQUENCY: 2x/week  PT DURATION: 6 weeks  PLANNED INTERVENTIONS: Therapeutic exercises, Therapeutic activity, Patient/Family education, Self Care, Dry Needling, Electrical stimulation, Cryotherapy, Moist heat, Ultrasound, and Manual therapy.  PLAN FOR NEXT SESSION: TPR to QL.  Chin tucks and extension, core exercise progression mindful of spondylolistesis, spinal protection techniques, body mechanics training.  Modalities and STW/M as needed.  Recert   Derward Marple,CHRIS, PTA 08/22/2023, 11:22 AM

## 2023-08-28 ENCOUNTER — Encounter: Payer: Self-pay | Admitting: *Deleted

## 2023-08-28 ENCOUNTER — Ambulatory Visit: Attending: Family Medicine | Admitting: *Deleted

## 2023-08-28 DIAGNOSIS — M5459 Other low back pain: Secondary | ICD-10-CM | POA: Diagnosis present

## 2023-08-28 DIAGNOSIS — M542 Cervicalgia: Secondary | ICD-10-CM | POA: Insufficient documentation

## 2023-08-28 DIAGNOSIS — M62838 Other muscle spasm: Secondary | ICD-10-CM | POA: Insufficient documentation

## 2023-08-28 NOTE — Therapy (Signed)
OUTPATIENT PHYSICAL THERAPY SPINE TREATMENT   Patient Name: Shelby Skinner MRN: [id number] DOB:December 01, 1961, 61 y.o., female Today's Date: 08/28/2023  END OF SESSION:  PT End of Session - 08/28/23 0851     Visit Number 15    Number of Visits 18    Date for PT Re-Evaluation 09/05/23    PT Start Time 0851    PT Stop Time 0938    PT Time Calculation (min) 47 min             History reviewed. No pertinent past medical history. Past Surgical History:  Procedure Laterality Date   APPENDECTOMY  2010   BREAST CYST ASPIRATION     Patient Active Problem List   Diagnosis Date Noted   Vitamin D deficiency 06/10/2023   Neck pain, chronic 06/10/2023   Spondylosis 02/23/2019   REFERRING PROVIDER: Gilford Silvius  REFERRING DIAG: Spondylosis.  Rationale for Evaluation and Treatment: Rehabilitation  THERAPY DIAG:  Cervicalgia  Other low back pain  Other muscle spasm  ONSET DATE: "Years ago"  SUBJECTIVE:                                                                                                                                                                                           SUBJECTIVE STATEMENT:  Did    PERTINENT HISTORY:  H/o spinal pain.    PAIN:  Are you having pain? Mild today.  Ache, "tension".  PRECAUTIONS: Other: Spondylolisthesis.  WEIGHT BEARING RESTRICTIONS: No  FALLS:  Has patient fallen in last 6 months? No  LIVING ENVIRONMENT: Lives with: lives with their spouse Lives in: House/apartment Has following equipment at home: None  OCCUPATION: Retired.  PLOF: Independent  PATIENT GOALS: Decrease tension, do more with less pain.  OBJECTIVE:   DIAGNOSTIC FINDINGS:   04/12/19:  IMPRESSION: Abnormal MRI scan cervical spine showing abnormal curvature as well as prominent disc degenerative changes from C3-C6 but without significant compression.  No abnormal enhancing lesions are noted.   06/14/23:  IMPRESSION: 1. Moderate facet hypertrophy  at L3-L4, L4-L5 and L5-S1. 2. Grade 1 anterolisthesis of L4 on L5 and L5 on S1.  06/14/23:  IMPRESSION: 1. Degenerative disc disease from C4-C5 through C6-C7. 2. Moderate C7-T1 facet hypertrophy. Lesser facet hypertrophy at C4-C5. 3. C4-C5 bony neural foraminal narrowing on the right. 4. Mild scoliosis. Trace retrolisthesis of C6 on C7 and anterolisthesis of C7 on T1.    PATIENT SURVEYS:  FOTO .   POSTURE: rounded shoulders, forward head, and decreased lordosis.  PALPATION: "Tension" reported over cervical paraspinal musculature.  Increased tone over bilateral UT's and [employer].  Pain c/o in L5-S1  and bilateral SIJ regions radiating into both hips.  LUMBAR/CERVICAL ROM:  Full active flexion.  Active right cervical rotation is 68 degrees and left is 65 degrees.      LOWER EXTREMITY MMT:   Patient able to provide normal strength grade via MMTing for major muscle groups of bilateral U and LE's.  LUMBAR SPECIAL TESTS:  Equal leg lengths. (-) SLR testing. (-) FABER  and Sacral Press testing.   Absent right Achilles reflex.  GAIT: WNL.   TREATMENT            Date:                                                                     08-28-23  Discussed  and reviwed cardio exs as well as other HEP exs. [place] Estim combo 1.5 w/cm2 x12 mins to cerv. Paras Manual    Mild traction f/b STW and TPR  to Bil cer paras supine                                                                                                           EXERCISE LOG  Exercise Repetitions and Resistance Comments  Dying Bug    Bridge    Sidelying SLR    Cat/ camel    Standing modified       Bird-dog  Decreased hold time and cues to not extend LE as much  Squats    Tband Rows    Tband Shldr Ext     Blank cell = exercise not performed today         CLINICAL IMPRESSION: Pt arrived today reporting that she  did okay after last Rx , but had a flare up yesterday and notice tband press was a pain  trigger. Reviewed exs and modified as needed for HEP. Rx focused on [place] combo and STW to cerv paras and manual Traction. No LTG's met today due to pain     OBJECTIVE IMPAIRMENTS: decreased activity tolerance, decreased ROM, increased muscle spasms, postural dysfunction, and pain.   ACTIVITY LIMITATIONS: sitting  PERSONAL FACTORS: Time since onset of injury/illness/exacerbation are also affecting patient's functional outcome.   REHAB POTENTIAL: Good  CLINICAL DECISION MAKING: Evolving/moderate complexity  EVALUATION COMPLEXITY: Low   GOALS:  LONG TERM GOALS: Target date: 08/01/23  Ind with an HEP.  Goal status: Partially met  2.  Sitting 30 minutes with pain not > 3/10.  Goal status:Partially met  3.  Perform ADL's with pain not > 3/10.  Goal status: On going  PLAN:  PT FREQUENCY: 2x/week  PT DURATION: 6 weeks  PLANNED INTERVENTIONS: Therapeutic exercises, Therapeutic activity, Patient/Family education, Self Care, Dry Needling, Electrical stimulation, Cryotherapy, Moist heat, Ultrasound, and Manual therapy.  PLAN FOR NEXT SESSION: mindful of spondylolistesis, On going spinal protection techniques, body  mechanics training  Modalities and STW/M as needed.     Kellianne Ek,CHRIS, PTA 08/28/2023, 1:37 PM

## 2023-08-29 ENCOUNTER — Encounter: Admitting: *Deleted

## 2023-09-02 ENCOUNTER — Encounter: Payer: Self-pay | Admitting: *Deleted

## 2023-09-02 ENCOUNTER — Ambulatory Visit: Admitting: *Deleted

## 2023-09-02 DIAGNOSIS — M542 Cervicalgia: Secondary | ICD-10-CM

## 2023-09-02 DIAGNOSIS — M62838 Other muscle spasm: Secondary | ICD-10-CM

## 2023-09-02 DIAGNOSIS — M5459 Other low back pain: Secondary | ICD-10-CM

## 2023-09-02 NOTE — Therapy (Signed)
OUTPATIENT PHYSICAL THERAPY SPINE TREATMENT   Patient Name: Shelby Skinner MRN: [id number] DOB:1961-12-12, 62 y.o., female Today's Date: 09/02/2023  END OF SESSION:  PT End of Session - 09/02/23 1022     Visit Number 16    Number of Visits 18    Date for PT Re-Evaluation 09/05/23    PT Start Time 1022    PT Stop Time 1108    PT Time Calculation (min) 46 min             History reviewed. No pertinent past medical history. Past Surgical History:  Procedure Laterality Date   APPENDECTOMY  2010   BREAST CYST ASPIRATION     Patient Active Problem List   Diagnosis Date Noted   Vitamin D deficiency 06/10/2023   Neck pain, chronic 06/10/2023   Spondylosis 02/23/2019   REFERRING PROVIDER: Gilford Silvius  REFERRING DIAG: Spondylosis.  Rationale for Evaluation and Treatment: Rehabilitation  THERAPY DIAG:  Cervicalgia  Other low back pain  Other muscle spasm  ONSET DATE: "Years ago"  SUBJECTIVE:                                                                                                                                                                                           SUBJECTIVE STATEMENT:  Have had a couple of good days. Today 3-4/10. Hip flexor tightness today   PERTINENT HISTORY:  H/o spinal pain.    PAIN:  Are you having pain? Mild today.  Ache, "tension".  PRECAUTIONS: Other: Spondylolisthesis.  WEIGHT BEARING RESTRICTIONS: No  FALLS:  Has patient fallen in last 6 months? No  LIVING ENVIRONMENT: Lives with: lives with their spouse Lives in: House/apartment Has following equipment at home: None  OCCUPATION: Retired.  PLOF: Independent  PATIENT GOALS: Decrease tension, do more with less pain.  OBJECTIVE:   DIAGNOSTIC FINDINGS:   04/12/19:  IMPRESSION: Abnormal MRI scan cervical spine showing abnormal curvature as well as prominent disc degenerative changes from C3-C6 but without significant compression.  No abnormal enhancing  lesions are noted.   06/14/23:  IMPRESSION: 1. Moderate facet hypertrophy at L3-L4, L4-L5 and L5-S1. 2. Grade 1 anterolisthesis of L4 on L5 and L5 on S1.  06/14/23:  IMPRESSION: 1. Degenerative disc disease from C4-C5 through C6-C7. 2. Moderate C7-T1 facet hypertrophy. Lesser facet hypertrophy at C4-C5. 3. C4-C5 bony neural foraminal narrowing on the right. 4. Mild scoliosis. Trace retrolisthesis of C6 on C7 and anterolisthesis of C7 on T1.    PATIENT SURVEYS:  FOTO .   POSTURE: rounded shoulders, forward head, and decreased lordosis.  PALPATION: "Tension" reported over cervical paraspinal musculature.  Increased tone  over bilateral UT's and Felicity Coyer Scap's.  Pain c/o in L5-S1 and bilateral SIJ regions radiating into both hips.  LUMBAR/CERVICAL ROM:  Full active flexion.  Active right cervical rotation is 68 degrees and left is 65 degrees.      LOWER EXTREMITY MMT:   Patient able to provide normal strength grade via MMTing for major muscle groups of bilateral U and LE's.  LUMBAR SPECIAL TESTS:  Equal leg lengths. (-) SLR testing. (-) FABER  and Sacral Press testing.   Absent right Achilles reflex.  GAIT: WNL.   TREATMENT            Date:                                                                     09-02-23  Discussed  and reviwed cardio exs as well as other HEP exs. Standing stride stance with glut activation 2x6 hold 5 secs each side ( notable decreased activation LT glut)  Standing  hip flexor stretch with glut activation Bridges x 10 hold 10 secs with glute activation first.  Manual hip flexor stretching with Pt Prone Bil.                                                                                                         EXERCISE LOG  Exercise Repetitions and Resistance Comments  Dying Bug    Bridge    Sidelying SLR    Cat/ camel    Standing modified       Bird-dog  Decreased hold time and cues to not extend LE as much  Squats    Tband Rows     Tband Shldr Ext     Blank cell = exercise not performed today         CLINICAL IMPRESSION: Pt arrived today reporting that she  did okay after last Rx and has had a couple of good days. Rx focused on      OBJECTIVE IMPAIRMENTS: decreased activity tolerance, decreased ROM, increased muscle spasms, postural dysfunction, and pain.   ACTIVITY LIMITATIONS: sitting  PERSONAL FACTORS: Time since onset of injury/illness/exacerbation are also affecting patient's functional outcome.   REHAB POTENTIAL: Good  CLINICAL DECISION MAKING: Evolving/moderate complexity  EVALUATION COMPLEXITY: Low   GOALS:  LONG TERM GOALS: Target date: 08/01/23  Ind with an HEP.  Goal status: Partially met  2.  Sitting 30 minutes with pain not > 3/10.  Goal status:Partially met  3.  Perform ADL's with pain not > 3/10.  Goal status: On going  PLAN:  PT FREQUENCY: 2x/week  PT DURATION: 6 weeks  PLANNED INTERVENTIONS: Therapeutic exercises, Therapeutic activity, Patient/Family education, Self Care, Dry Needling, Electrical stimulation, Cryotherapy, Moist heat, Ultrasound, and Manual therapy.  PLAN FOR NEXT SESSION: mindful of spondylolistesis, On going spinal protection techniques, body mechanics training  Modalities and STW/M as needed.     Lum Stillinger,CHRIS, PTA 09/02/2023, 1:06 PM

## 2023-09-05 ENCOUNTER — Ambulatory Visit: Admitting: *Deleted

## 2023-09-05 DIAGNOSIS — M542 Cervicalgia: Secondary | ICD-10-CM

## 2023-09-05 DIAGNOSIS — M62838 Other muscle spasm: Secondary | ICD-10-CM

## 2023-09-05 DIAGNOSIS — M5459 Other low back pain: Secondary | ICD-10-CM

## 2023-09-05 NOTE — Therapy (Signed)
OUTPATIENT PHYSICAL THERAPY SPINE TREATMENT   Patient Name: Shelby Skinner MRN: [id number] DOB:1961/02/17, 62 y.o., female Today's Date: 09/05/2023  END OF SESSION:  PT End of Session - 09/05/23 1105     Visit Number 17    Number of Visits 18    Date for PT Re-Evaluation 09/05/23    PT Start Time 1015    PT Stop Time 1105    PT Time Calculation (min) 50 min             No past medical history on file. Past Surgical History:  Procedure Laterality Date   APPENDECTOMY  2010   BREAST CYST ASPIRATION     Patient Active Problem List   Diagnosis Date Noted   Vitamin D deficiency 06/10/2023   Neck pain, chronic 06/10/2023   Spondylosis 02/23/2019   REFERRING PROVIDER: Gilford Silvius  REFERRING DIAG: Spondylosis.  Rationale for Evaluation and Treatment: Rehabilitation  THERAPY DIAG:  Cervicalgia  Other low back pain  Other muscle spasm  ONSET DATE: "Years ago"  SUBJECTIVE:                                                                                                                                                                                           SUBJECTIVE STATEMENT:  Have had a couple of good days. Today 3-4/10. Hip flexor tightness today   PERTINENT HISTORY:  H/o spinal pain.    PAIN:  Are you having pain? Mild today.  Ache, "tension".  PRECAUTIONS: Other: Spondylolisthesis.  WEIGHT BEARING RESTRICTIONS: No  FALLS:  Has patient fallen in last 6 months? No  LIVING ENVIRONMENT: Lives with: lives with their spouse Lives in: House/apartment Has following equipment at home: None  OCCUPATION: Retired.  PLOF: Independent  PATIENT GOALS: Decrease tension, do more with less pain.  OBJECTIVE:   DIAGNOSTIC FINDINGS:   04/12/19:  IMPRESSION: Abnormal MRI scan cervical spine showing abnormal curvature as well as prominent disc degenerative changes from C3-C6 but without significant compression.  No abnormal enhancing lesions are noted.    06/14/23:  IMPRESSION: 1. Moderate facet hypertrophy at L3-L4, L4-L5 and L5-S1. 2. Grade 1 anterolisthesis of L4 on L5 and L5 on S1.  06/14/23:  IMPRESSION: 1. Degenerative disc disease from C4-C5 through C6-C7. 2. Moderate C7-T1 facet hypertrophy. Lesser facet hypertrophy at C4-C5. 3. C4-C5 bony neural foraminal narrowing on the right. 4. Mild scoliosis. Trace retrolisthesis of C6 on C7 and anterolisthesis of C7 on T1.    PATIENT SURVEYS:  FOTO .   POSTURE: rounded shoulders, forward head, and decreased lordosis.  PALPATION: "Tension" reported over cervical paraspinal musculature.  Increased tone over  bilateral UT's and Lev Scap's.  Pain c/o in L5-S1 and bilateral SIJ regions radiating into both hips.  LUMBAR/CERVICAL ROM:  Full active flexion.  Active right cervical rotation is 68 degrees and left is 65 degrees.      LOWER EXTREMITY MMT:   Patient able to provide normal strength grade via MMTing for major muscle groups of bilateral U and LE's.  LUMBAR SPECIAL TESTS:  Equal leg lengths. (-) SLR testing. (-) FABER  and Sacral Press testing.   Absent right Achilles reflex.  GAIT: WNL.   TREATMENT            Date:                                                                     09-05-23  Discussed  and reviewed  HEP and added adductor squeeze with bridge and standing Quad / hip flexor stretching.  Bridges x 10 hold with adductor squeeze hold 10 secs   Manual Bil. hip flexor/ quad stretching with Pt Prone                                                                                                         EXERCISE LOG  Exercise Repetitions and Resistance Comments  Dying Bug    Bridge    Sidelying SLR    Cat/ camel    Standing modified       Bird-dog  Decreased hold time and cues to not extend LE as much  Squats    Tband Rows    Tband Shldr Ext     Blank cell = exercise not performed today         CLINICAL IMPRESSION: Pt arrived today reporting  that she  did okay after last Rx and has had a couple of good days. Rx focused on HEP review with added bridge/adductor squeeze as well as manual hip flexor and quad stretching prone. Pt did better today after Rx with less muscle guarding and protective tightening  DC after next visit     OBJECTIVE IMPAIRMENTS: decreased activity tolerance, decreased ROM, increased muscle spasms, postural dysfunction, and pain.   ACTIVITY LIMITATIONS: sitting  PERSONAL FACTORS: Time since onset of injury/illness/exacerbation are also affecting patient's functional outcome.   REHAB POTENTIAL: Good  CLINICAL DECISION MAKING: Evolving/moderate complexity  EVALUATION COMPLEXITY: Low   GOALS:  LONG TERM GOALS: Target date: 08/01/23  Ind with an HEP.  Goal status: Partially met  2.  Sitting 30 minutes with pain not > 3/10.  Goal status:Partially met  3.  Perform ADL's with pain not > 3/10.  Goal status: On going  PLAN:  PT FREQUENCY: 2x/week  PT DURATION: 6 weeks  PLANNED INTERVENTIONS: Therapeutic exercises, Therapeutic activity, Patient/Family education, Self Care, Dry Needling, Electrical stimulation, Cryotherapy, Moist heat, Ultrasound, and Manual therapy.  PLAN FOR NEXT SESSION: DC after next visit  Hendrix Yurkovich,CHRIS, PTA 09/05/2023, 11:35 AM

## 2023-09-09 ENCOUNTER — Encounter: Admitting: *Deleted

## 2023-09-12 ENCOUNTER — Ambulatory Visit: Admitting: *Deleted

## 2023-09-12 ENCOUNTER — Encounter: Payer: Self-pay | Admitting: *Deleted

## 2023-09-12 DIAGNOSIS — M62838 Other muscle spasm: Secondary | ICD-10-CM

## 2023-09-12 DIAGNOSIS — M542 Cervicalgia: Secondary | ICD-10-CM

## 2023-09-12 DIAGNOSIS — M5459 Other low back pain: Secondary | ICD-10-CM

## 2023-09-12 NOTE — Therapy (Addendum)
OUTPATIENT PHYSICAL THERAPY SPINE TREATMENT   Patient Name: Shelby Skinner MRN: [id number] DOB:02/14/1961, 62 y.o., female Today's Date: 09/12/2023  END OF SESSION:  PT End of Session - 09/12/23 1136     Visit Number 18    Number of Visits 18    Date for PT Re-Evaluation 09/05/23    PT Start Time 1015    PT Stop Time 1112    PT Time Calculation (min) 57 min              History reviewed. No pertinent past medical history. Past Surgical History:  Procedure Laterality Date   APPENDECTOMY  2010   BREAST CYST ASPIRATION     Patient Active Problem List   Diagnosis Date Noted   Vitamin D deficiency 06/10/2023   Neck pain, chronic 06/10/2023   Spondylosis 02/23/2019   REFERRING PROVIDER: Gilford Silvius  REFERRING DIAG: Spondylosis.  Rationale for Evaluation and Treatment: Rehabilitation  THERAPY DIAG:  Cervicalgia  Other muscle spasm  Other low back pain  ONSET DATE: "Years ago"  SUBJECTIVE:                                                                                                                                                                                           SUBJECTIVE STATEMENT:  Have had a couple of good days. Today 3-4/10. Hip flexor tightness today   PERTINENT HISTORY:  H/o spinal pain.    PAIN:  Are you having pain? Mild today.  Ache, "tension".  PRECAUTIONS: Other: Spondylolisthesis.  WEIGHT BEARING RESTRICTIONS: No  FALLS:  Has patient fallen in last 6 months? No  LIVING ENVIRONMENT: Lives with: lives with their spouse Lives in: House/apartment Has following equipment at home: None  OCCUPATION: Retired.  PLOF: Independent  PATIENT GOALS: Decrease tension, do more with less pain.  OBJECTIVE:   DIAGNOSTIC FINDINGS:   04/12/19:  IMPRESSION: Abnormal MRI scan cervical spine showing abnormal curvature as well as prominent disc degenerative changes from C3-C6 but without significant compression.  No abnormal enhancing  lesions are noted.   06/14/23:  IMPRESSION: 1. Moderate facet hypertrophy at L3-L4, L4-L5 and L5-S1. 2. Grade 1 anterolisthesis of L4 on L5 and L5 on S1.  06/14/23:  IMPRESSION: 1. Degenerative disc disease from C4-C5 through C6-C7. 2. Moderate C7-T1 facet hypertrophy. Lesser facet hypertrophy at C4-C5. 3. C4-C5 bony neural foraminal narrowing on the right. 4. Mild scoliosis. Trace retrolisthesis of C6 on C7 and anterolisthesis of C7 on T1.    PATIENT SURVEYS:  FOTO .   POSTURE: rounded shoulders, forward head, and decreased lordosis.  PALPATION: "Tension" reported over cervical paraspinal musculature.  Increased  tone over bilateral UT's and [employer].  Pain c/o in L5-S1 and bilateral SIJ regions radiating into both hips.  LUMBAR/CERVICAL ROM:  Full active flexion.  Active right cervical rotation is 68 degrees and left is 65 degrees.      LOWER EXTREMITY MMT:   Patient able to provide normal strength grade via MMTing for major muscle groups of bilateral U and LE's.  LUMBAR SPECIAL TESTS:  Equal leg lengths. (-) SLR testing. (-) FABER  and Sacral Press testing.   Absent right Achilles reflex.  GAIT: WNL.   TREATMENT            Date:                                                                     09-12-23 HEP: reviewed and finalized current HEP with added Lat pll down and ROW, standing Psoas stretching both sides,Hip flexor stretching on mat demoed by Pt and did well no flare ups. Added hip rotation strengthening in standing pivot with future progression using [email or web address] Kitchen                                                                                                           EXERCISE LOG  Exercise Repetitions and Resistance Comments  Dying Bug    Bridge    Sidelying SLR    Cat/ camel    Standing modified       Bird-dog  Decreased hold time and cues to not extend LE as much  Squats    Tband Rows    Tband Shldr Ext     Blank cell = exercise not performed today          CLINICAL IMPRESSION: Pt arrived today doing fairly well with 2-3/10 pain. Rx focused on therex review for HEP and body mechanics and movement patterns. Added Psoas stretch in standing as well as Lat/back progressions. Standing hip rotations from pivoting and progressing with wts.. Majority of LTGs met. DC to HEP      OBJECTIVE IMPAIRMENTS: decreased activity tolerance, decreased ROM, increased muscle spasms, postural dysfunction, and pain.   ACTIVITY LIMITATIONS: sitting  PERSONAL FACTORS: Time since onset of injury/illness/exacerbation are also affecting patient's functional outcome.   REHAB POTENTIAL: Good  CLINICAL DECISION MAKING: Evolving/moderate complexity  EVALUATION COMPLEXITY: Low   GOALS:  LONG TERM GOALS: Target date: 08/01/23  Ind with an HEP.  Goal status: MET  2.  Sitting 30 minutes with pain not > 3/10.  Goal status: MET  3.  Perform ADL's with pain not > 3/10.  Goal status: Partially met some days 5-6/10 depending on activity  PLAN:  PT FREQUENCY: 2x/week  PT DURATION: 6 weeks  PLANNED INTERVENTIONS: Therapeutic exercises, Therapeutic activity, Patient/Family education, Self Care, Dry Needling, Electrical stimulation, Cryotherapy, Moist heat, Ultrasound, and Manual  therapy.  PLAN FOR NEXT SESSION: DC to HEP  Dinari Stgermaine,CHRIS, PTA 09/12/2023, 11:49 AM   PHYSICAL THERAPY DISCHARGE SUMMARY  Visits from Start of Care: 18.  Current functional level related to goals / functional outcomes: See above.   Remaining deficits: See goal section.   Education / Equipment: HEP.   Patient agrees to discharge. Patient goals were partially met. Patient is being discharged due to being pleased with the current functional level.    [place]aly Applegate MPT

## 2024-06-10 ENCOUNTER — Encounter: Admitting: Family Medicine

## 2024-07-02 ENCOUNTER — Ambulatory Visit

## 2024-07-20 ENCOUNTER — Telehealth: Payer: Self-pay | Admitting: Family Medicine

## 2024-07-20 NOTE — Telephone Encounter (Signed)
 Spoke to patient about the bill that she had received for xrays that were done in 2024. Patient paid while on the phone. Closing encounter

## 2024-07-20 NOTE — Telephone Encounter (Unsigned)
 Copied from CRM [id number]. Topic: Medical Record Request - Payor/Billing Request >> Jul 20, 2024  8:34 AM Willma SAUNDERS wrote: Reason for CRM: Patient states she received a bill from [facility] and would like to speak to someone directly to discuss what the bill is for. Tried to speak to billing yesterday but got stuck in an automated system and wasn't able to speak with anyone.  Patient can be reached at [phone]

## 2024-08-17 ENCOUNTER — Encounter: Payer: Self-pay | Admitting: Family Medicine

## 2024-08-17 ENCOUNTER — Ambulatory Visit: Payer: Self-pay | Admitting: Family Medicine

## 2024-08-17 ENCOUNTER — Ambulatory Visit: Admitting: Family Medicine

## 2024-08-17 VITALS — BP 122/78 | HR 80 | Temp 97.6°F | Ht 62.0 in | Wt 121.0 lb

## 2024-08-17 DIAGNOSIS — E559 Vitamin D deficiency, unspecified: Secondary | ICD-10-CM | POA: Diagnosis not present

## 2024-08-17 DIAGNOSIS — Z136 Encounter for screening for cardiovascular disorders: Secondary | ICD-10-CM

## 2024-08-17 DIAGNOSIS — Z130 Encounter for screening for diseases of the blood and blood-forming organs and certain disorders involving the immune mechanism: Secondary | ICD-10-CM

## 2024-08-17 DIAGNOSIS — Z0000 Encounter for general adult medical examination without abnormal findings: Secondary | ICD-10-CM

## 2024-08-17 DIAGNOSIS — M2550 Pain in unspecified joint: Secondary | ICD-10-CM | POA: Diagnosis not present

## 2024-08-17 DIAGNOSIS — Z0001 Encounter for general adult medical examination with abnormal findings: Secondary | ICD-10-CM | POA: Diagnosis not present

## 2024-08-17 DIAGNOSIS — Z23 Encounter for immunization: Secondary | ICD-10-CM | POA: Diagnosis not present

## 2024-08-17 LAB — BAYER DCA HB A1C WAIVED: HB A1C (BAYER DCA - WAIVED): 5.2 % (ref 4.8–5.6)

## 2024-08-17 NOTE — Progress Notes (Signed)
 Complete physical exam  Patient: Shelby Skinner   DOB: Feb 07, 1961   63 y.o. Female  MRN: [id number]  Subjective:    Chief Complaint  Patient presents with   Annual Exam    Shelby Skinner is a 63 y.o. female who presents today for a complete physical exam. She reports consuming a general, low fat, and low sodium diet. Home exercise routine includes treadmill and walking 1-3 hrs per day. She generally feels well. She reports sleeping well. She does not have additional problems to discuss today.   Shelby Skinner is a 63 year old female who presents for an annual physical exam.  She has experienced improvement in her long COVID symptoms and no longer takes Zyrtec daily, only occasionally as needed for allergies. She continues to take vitamin D  and magnesium, although she occasionally forgets to take them.  She recalls a past episode of a persistent cough lasting for weeks, which she suspects was related to a viral infection, possibly COVID-19. This cough affected her exercise routine, but she is now trying to resume her physical activities.  She experiences muscle aches and pains, primarily related to her neck and cervical spine issues. Physical therapy has helped her understand and manage these symptoms, and staying active helps alleviate the discomfort.  No changes in vision or hearing, and she recently got new glasses. She visits the dentist regularly every six months and reports no dental problems. Her last Pap smear in 2021 was normal, and she is scheduled for a mammogram in September 2025.  No significant changes in bowel or bladder habits, although she occasionally experiences positional bladder issues, which she attributes to spinal problems. She is working on strengthening her core to address these issues.  She does not take any regular medications and has not experienced headaches or checked her blood pressure at home.  She mentions a dietary change due to her husband's  prostate cancer diagnosis, which was found incidentally. She is trying to return to a healthier diet, similar to when she was a vegetarian and juicing in the past.       Most recent fall risk assessment:    08/17/2024   10:11 AM  Fall Risk   Falls in the past year? 0  Injury with Fall? 0  Risk for fall due to : No Fall Risks  Follow up Falls evaluation completed     Most recent depression screenings:    08/17/2024   10:12 AM 06/10/2023   10:09 AM  PHQ 2/9 Scores  PHQ - 2 Score 0 0  PHQ- 9 Score 1 2    Vision:Within last year and Dental: No current dental problems and Receives regular dental care  Patient Active Problem List   Diagnosis Date Noted   Vitamin D  deficiency 06/10/2023   Neck pain, chronic 06/10/2023   Spondylosis 02/23/2019   History reviewed. No pertinent past medical history. Past Surgical History:  Procedure Laterality Date   APPENDECTOMY  2010   BREAST CYST ASPIRATION     Social History   Tobacco Use   Smoking status: Never   Smokeless tobacco: Never  Vaping Use   Vaping status: Never Used  Substance Use Topics   Alcohol use: Yes    Alcohol/week: 1.0 standard drink of alcohol    Types: 1 Glasses of wine per week   Social History   Socioeconomic History   Marital status: Married    Spouse name: Not on file   Number of children: Not on file  Years of education: Not on file   Highest education level: Not on file  Occupational History   Not on file  Tobacco Use   Smoking status: Never   Smokeless tobacco: Never  Vaping Use   Vaping status: Never Used  Substance and Sexual Activity   Alcohol use: Yes    Alcohol/week: 1.0 standard drink of alcohol    Types: 1 Glasses of wine per week   Drug use: Not on file   Sexual activity: Not on file  Other Topics Concern   Not on file  Social History Narrative   Not on file   Social Drivers of Health   Financial Resource Strain: Not on file  Food Insecurity: Not on file  Transportation  Needs: Not on file  Physical Activity: Not on file  Stress: Not on file  Social Connections: Unknown (05/04/2022)   Received from [facility]   Social Network    Social Network: Not on file  Intimate Partner Violence: Unknown (03/26/2022)   Received from [facility]   HITS    Physically Hurt: Not on file    Insult or Talk Down To: Not on file    Threaten Physical Harm: Not on file    Scream or Curse: Not on file   Family Status  Relation Name Status   Mother  Alive   Father  Alive   Sister  Alive   Sister  Alive   Oceanographer  (Not Specified)  No partnership data on file   Family History  Problem Relation Age of Onset   Multiple sclerosis Mother    Crohn's disease Mother    Hypertension Father    Breast cancer Sister    Breast cancer Paternal Aunt    Allergies  Allergen Reactions   Gluten Meal Swelling, Palpitations and Other (See Comments)    GI issues   Fluticasone  Propionate Other (See Comments)    Nose bleed    Codeine Nausea Only   Other Diarrhea, Swelling and Other (See Comments)    Tree nuts   Mushroom Extract Complex (Obsolete) Nausea Only, Palpitations and Other (See Comments)    Jelly mushrooms. Tremors, dry mouth and dizziness.       Patient Care Team: Severa Rock HERO, FNP as PCP - General (Family Medicine)   Outpatient Medications Prior to Visit  Medication Sig   cholecalciferol (VITAMIN D3) 25 MCG (1000 UNIT) tablet Take 1,000 Units by mouth daily.   magnesium gluconate (MAGONATE) 500 (27 Mg) MG TABS tablet Take 500 mg by mouth in the morning and at bedtime.   Menaquinone-7 (K2 PO) Take by mouth.   [DISCONTINUED] cetirizine (ZYRTEC ALLERGY) 10 MG tablet    [DISCONTINUED] vitamin C (ASCORBIC ACID) 500 MG tablet Take 1,000 mg by mouth daily.    No facility-administered medications prior to visit.    ROS per HPI      Objective:     BP 122/78 (Cuff Size: Normal)   Pulse 80   Temp 97.6 F (36.4 C)   Ht 5' 2 (1.575 m)   Wt 121 lb (54.9 kg)    LMP 05/23/2013 (Approximate)   SpO2 100%   BMI 22.13 kg/m  BP Readings from Last 3 Encounters:  08/17/24 122/78  06/10/23 137/75  06/05/22 121/70   Wt Readings from Last 3 Encounters:  08/17/24 121 lb (54.9 kg)  06/10/23 125 lb 9.6 oz (57 kg)  06/05/22 133 lb (60.3 kg)   SpO2 Readings from Last 3 Encounters:  08/17/24  100%  06/10/23 100%  06/05/22 98%      Physical Exam Vitals and nursing note reviewed.  Constitutional:      General: She is not in acute distress.    Appearance: Normal appearance. She is well-developed and well-groomed. She is not ill-appearing, toxic-appearing or diaphoretic.  HENT:     Head: Normocephalic and atraumatic.     Jaw: There is normal jaw occlusion.     Right Ear: Hearing, tympanic membrane, ear canal and external ear normal.     Left Ear: Hearing, tympanic membrane, ear canal and external ear normal.     Nose: Nose normal.     Mouth/Throat:     Lips: Pink.     Mouth: Mucous membranes are moist.     Pharynx: Oropharynx is clear. Uvula midline. No oropharyngeal exudate or posterior oropharyngeal erythema.  Eyes:     General: Lids are normal.     Extraocular Movements: Extraocular movements intact.     Conjunctiva/sclera: Conjunctivae normal.     Pupils: Pupils are equal, round, and reactive to light.  Neck:     Thyroid : No thyroid  mass, thyromegaly or thyroid  tenderness.     Vascular: No carotid bruit or JVD.     Trachea: Trachea and phonation normal.  Cardiovascular:     Rate and Rhythm: Normal rate and regular rhythm.     Chest Wall: PMI is not displaced.     Pulses: Normal pulses.     Heart sounds: Normal heart sounds. No murmur heard.    No friction rub. No gallop.  Pulmonary:     Effort: Pulmonary effort is normal. No respiratory distress.     Breath sounds: Normal breath sounds. No wheezing.  Abdominal:     General: Bowel sounds are normal. There is no distension or abdominal bruit.     Palpations: Abdomen is soft. There is  no hepatomegaly or splenomegaly.     Tenderness: There is no abdominal tenderness. There is no right CVA tenderness or left CVA tenderness.     Hernia: No hernia is present.  Musculoskeletal:        General: Normal range of motion.     Cervical back: Normal range of motion and neck supple.     Right lower leg: No edema.     Left lower leg: No edema.  Lymphadenopathy:     Cervical: No cervical adenopathy.  Skin:    General: Skin is warm and dry.     Capillary Refill: Capillary refill takes less than 2 seconds.     Coloration: Skin is not cyanotic, jaundiced or pale.     Findings: No rash.  Neurological:     General: No focal deficit present.     Mental Status: She is alert and oriented to person, place, and time.     Sensory: Sensation is intact.     Motor: Motor function is intact.     Coordination: Coordination is intact.     Gait: Gait is intact.     Deep Tendon Reflexes: Reflexes are normal and symmetric.  Psychiatric:        Attention and Perception: Attention and perception normal.        Mood and Affect: Mood and affect normal.        Speech: Speech normal.        Behavior: Behavior normal. Behavior is cooperative.        Thought Content: Thought content normal.        Cognition and Memory:  Cognition and memory normal.        Judgment: Judgment normal.      Results for orders placed or performed in visit on 08/17/24  Bayer DCA Hb A1c Waived  Result Value Ref Range   HB A1C (BAYER DCA - WAIVED) 5.2 4.8 - 5.6 %   Last CBC Lab Results  Component Value Date   WBC 4.6 06/10/2023   HGB 13.0 06/10/2023   HCT 38.7 06/10/2023   MCV 85 06/10/2023   MCH 28.5 06/10/2023   RDW 13.2 06/10/2023   PLT 239 06/10/2023   Last metabolic panel Lab Results  Component Value Date   GLUCOSE 91 06/10/2023   NA 141 06/10/2023   K 4.9 06/10/2023   CL 103 06/10/2023   CO2 25 06/10/2023   BUN 10 06/10/2023   CREATININE 0.86 06/10/2023   EGFR 77 06/10/2023   CALCIUM 9.9 06/10/2023    PROT 7.1 06/10/2023   ALBUMIN 4.7 06/10/2023   LABGLOB 2.4 06/10/2023   AGRATIO 2.4 (H) 06/05/2022   BILITOT 0.7 06/10/2023   ALKPHOS 78 06/10/2023   AST 31 06/10/2023   ALT 15 06/10/2023   Last lipids Lab Results  Component Value Date   CHOL 203 (H) 06/10/2023   HDL 48 06/10/2023   LDLCALC 136 (H) 06/10/2023   TRIG 106 06/10/2023   CHOLHDL 4.2 06/10/2023   Last hemoglobin A1c Lab Results  Component Value Date   HGBA1C 5.2 08/17/2024   Last thyroid  functions Lab Results  Component Value Date   TSH 0.718 06/10/2023   T4TOTAL 9.0 06/10/2023   Last vitamin D  Lab Results  Component Value Date   VD25OH 33.8 06/10/2023   Last vitamin B12 and Folate Lab Results  Component Value Date   VITAMINB12 442 06/10/2023   FOLATE 5.9 06/10/2023        Assessment & Plan:    Routine Health Maintenance and Physical Exam  Immunization History  Administered Date(s) Administered   Hepatitis A 10/17/2018   Hepatitis A, Adult 10/17/2018, 04/19/2019   Hepatitis B 01/29/2005, 03/05/2005, 08/06/2005   Hepatitis B, ADULT 01/29/2005, 03/05/2005, 08/06/2005   Influenza-Unspecified 10/17/2018   PFIZER(Purple Top)SARS-COV-2 Vaccination 08/16/2020, 11/21/2020   Tdap 02/02/2020   Typhoid Inactivated 10/15/2018   Zoster Recombinant(Shingrix ) 08/17/2024    Health Maintenance  Topic Date Due   MAMMOGRAM  06/17/2024   COVID-19 Vaccine (3 - 2024-25 season) 09/02/2024 (Originally 08/24/2023)   INFLUENZA VACCINE  03/22/2025 (Originally 07/23/2024)   Cervical Cancer Screening (HPV/Pap Cotest)  08/17/2025 (Originally 06/20/2023)   Pneumococcal Vaccine: 50+ Years (1 of 1 - PCV) 08/17/2025 (Originally 03/06/2012)   Zoster Vaccines- Shingrix  (2 of 2) 10/12/2024   Colonoscopy  03/12/2026   DTaP/Tdap/Td (2 - Td or Tdap) 02/01/2030   Hepatitis B Vaccines 19-59 Average Risk  Completed   Hepatitis C Screening  Completed   HIV Screening  Completed   HPV VACCINES  Aged Out   Meningococcal B Vaccine   Aged Out    Discussed health benefits of physical activity, and encouraged her to engage in regular exercise appropriate for her age and condition.  Problem List Items Addressed This Visit       Other   Vitamin D  deficiency   Relevant Orders   VITAMIN D  25 Hydroxy (Vit-D Deficiency, Fractures)   CMP14+EGFR   Other Visit Diagnoses       Annual physical exam    -  Primary   Relevant Orders   Lipid panel   CBC with Differential/Platelet  CMP14+EGFR     Encounter for screening for cardiovascular disorders       Relevant Orders   Lipid panel   CBC with Differential/Platelet   CMP14+EGFR     Screening for endocrine, nutritional, metabolic and immunity disorder       Relevant Orders   Lipid panel   Thyroid  Panel With TSH   CBC with Differential/Platelet   CMP14+EGFR   Bayer DCA Hb A1c Waived (Completed)   Insulin , random     Multiple joint pain       Relevant Orders   Uric Acid   C-reactive protein         Adult Wellness Visit Routine adult wellness visit with well-controlled blood pressure at 122/78 mmHg and BMI of 22. No significant changes in vision, hearing, or dental health. Occasional positional bladder issues. Active lifestyle with dietary and exercise improvements. Previous labs showed minimally elevated total cholesterol and LDL with good HDL. - Order comprehensive lab work including kidney function, liver function, electrolytes, blood count, vitamin D , cholesterol, thyroid  function, uric acid, C-reactive protein, and random insulin . - Mammogram scheduled for September. - Advise continuation of current diet and exercise regimen.  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  Cervicalgia Chronic neck pain with muscle aches, exacerbated by tension and relieved by physical therapy. - Order C-reactive protein and uric acid tests under joint pain.  Hyperlipidemia Minimally elevated  total cholesterol and LDL with good HDL. Actively managing through diet and exercise. - Order cholesterol panel as part of comprehensive lab work.  Vitamin D  deficiency Continues vitamin D  supplementation with no current issues. - Order vitamin D  level as part of comprehensive lab work.  Allergic rhinitis Occasional use of Zyrtec for seasonal allergy symptoms, well-managed with current regimen.       Return in about 1 year (around 08/17/2025) for Annual Physical.     Rosaline Bruns, FNP

## 2024-08-18 LAB — CBC WITH DIFFERENTIAL/PLATELET
Basophils Absolute: 0.1 x10E3/uL (ref 0.0–0.2)
Basos: 1 %
EOS (ABSOLUTE): 0.1 x10E3/uL (ref 0.0–0.4)
Eos: 2 %
Hematocrit: 38.4 % (ref 34.0–46.6)
Hemoglobin: 13 g/dL (ref 11.1–15.9)
Immature Grans (Abs): 0 x10E3/uL (ref 0.0–0.1)
Immature Granulocytes: 0 %
Lymphocytes Absolute: 1.2 x10E3/uL (ref 0.7–3.1)
Lymphs: 29 %
MCH: 29.7 pg (ref 26.6–33.0)
MCHC: 33.9 g/dL (ref 31.5–35.7)
MCV: 88 fL (ref 79–97)
Monocytes Absolute: 0.3 x10E3/uL (ref 0.1–0.9)
Monocytes: 7 %
Neutrophils Absolute: 2.5 x10E3/uL (ref 1.4–7.0)
Neutrophils: 61 %
Platelets: 256 x10E3/uL (ref 150–450)
RBC: 4.38 x10E6/uL (ref 3.77–5.28)
RDW: 12.9 % (ref 11.7–15.4)
WBC: 4.2 x10E3/uL (ref 3.4–10.8)

## 2024-08-18 LAB — CMP14+EGFR
ALT: 13 IU/L (ref 0–32)
AST: 25 IU/L (ref 0–40)
Albumin: 4.6 g/dL (ref 3.9–4.9)
Alkaline Phosphatase: 71 IU/L (ref 44–121)
BUN/Creatinine Ratio: 13 (ref 12–28)
BUN: 10 mg/dL (ref 8–27)
Bilirubin Total: 0.4 mg/dL (ref 0.0–1.2)
CO2: 22 mmol/L (ref 20–29)
Calcium: 9.6 mg/dL (ref 8.7–10.3)
Chloride: 99 mmol/L (ref 96–106)
Creatinine, Ser: 0.79 mg/dL (ref 0.57–1.00)
Globulin, Total: 1.8 g/dL (ref 1.5–4.5)
Glucose: 87 mg/dL (ref 70–99)
Potassium: 4 mmol/L (ref 3.5–5.2)
Sodium: 137 mmol/L (ref 134–144)
Total Protein: 6.4 g/dL (ref 6.0–8.5)
eGFR: 85 mL/min/1.73 (ref >59)

## 2024-08-18 LAB — VITAMIN D 25 HYDROXY (VIT D DEFICIENCY, FRACTURES): Vit D, 25-Hydroxy: 80.6 ng/mL (ref 30.0–100.0)

## 2024-08-18 LAB — LIPID PANEL
Chol/HDL Ratio: 4.5 ratio — ABNORMAL HIGH (ref 0.0–4.4)
Cholesterol, Total: 201 mg/dL — ABNORMAL HIGH (ref 100–199)
HDL: 45 mg/dL (ref >39)
LDL Chol Calc (NIH): 136 mg/dL — ABNORMAL HIGH (ref 0–99)
Triglycerides: 108 mg/dL (ref 0–149)
VLDL Cholesterol Cal: 20 mg/dL (ref 5–40)

## 2024-08-18 LAB — THYROID PANEL WITH TSH
Free Thyroxine Index: 2.1 (ref 1.2–4.9)
T3 Uptake Ratio: 26 (ref 24–39)
T4, Total: 7.9 ug/dL (ref 4.5–12.0)
TSH: 1.42 u[IU]/mL (ref 0.450–4.500)

## 2024-08-18 LAB — C-REACTIVE PROTEIN: CRP: <1 mg/L (ref 0–10)

## 2024-08-18 LAB — URIC ACID: Uric Acid: 4.3 mg/dL (ref 3.0–7.2)

## 2024-08-18 LAB — INSULIN, RANDOM: INSULIN: 6.4 u[IU]/mL (ref 2.6–24.9)

## 2024-08-30 ENCOUNTER — Ambulatory Visit
Admission: RE | Admit: 2024-08-30 | Discharge: 2024-08-30 | Disposition: A | Discharge status: Home / Self Care | Source: Ambulatory Visit | Attending: Family Medicine

## 2024-08-30 ENCOUNTER — Other Ambulatory Visit: Payer: Self-pay | Admitting: Family

## 2024-08-30 DIAGNOSIS — Z1231 Encounter for screening mammogram for malignant neoplasm of breast: Secondary | ICD-10-CM

## 2024-11-30 ENCOUNTER — Ambulatory Visit

## 2025-01-03 ENCOUNTER — Ambulatory Visit

## 2025-01-25 ENCOUNTER — Ambulatory Visit

## 2025-02-17 ENCOUNTER — Ambulatory Visit

## 2025-02-17 ENCOUNTER — Ambulatory Visit: Admitting: Family Medicine

## 2025-08-19 ENCOUNTER — Encounter: Admitting: Family Medicine
